# Patient Record
Sex: Female | Born: 1955 | Race: White | Hispanic: No | Smoking: Never smoker
Health system: Southern US, Community
[De-identification: ages and names within clinical notes are randomized; demographics above are authoritative.]

## PROBLEM LIST (undated history)

## (undated) DIAGNOSIS — I2699 Other pulmonary embolism without acute cor pulmonale: Secondary | ICD-10-CM

## (undated) DIAGNOSIS — K5790 Diverticulosis of intestine, part unspecified, without perforation or abscess without bleeding: Secondary | ICD-10-CM

## (undated) DIAGNOSIS — H7210 Attic perforation of tympanic membrane, unspecified ear: Secondary | ICD-10-CM

## (undated) DIAGNOSIS — M8580 Other specified disorders of bone density and structure, unspecified site: Secondary | ICD-10-CM

## (undated) DIAGNOSIS — N641 Fat necrosis of breast: Secondary | ICD-10-CM

## (undated) DIAGNOSIS — R569 Unspecified convulsions: Secondary | ICD-10-CM

## (undated) DIAGNOSIS — F419 Anxiety disorder, unspecified: Secondary | ICD-10-CM

## (undated) DIAGNOSIS — E7800 Pure hypercholesterolemia, unspecified: Secondary | ICD-10-CM

## (undated) DIAGNOSIS — Z8489 Family history of other specified conditions: Secondary | ICD-10-CM

## (undated) DIAGNOSIS — M1990 Unspecified osteoarthritis, unspecified site: Secondary | ICD-10-CM

## (undated) DIAGNOSIS — E611 Iron deficiency: Secondary | ICD-10-CM

## (undated) DIAGNOSIS — F32A Depression, unspecified: Secondary | ICD-10-CM

## (undated) DIAGNOSIS — E785 Hyperlipidemia, unspecified: Secondary | ICD-10-CM

## (undated) DIAGNOSIS — K219 Gastro-esophageal reflux disease without esophagitis: Secondary | ICD-10-CM

## (undated) DIAGNOSIS — Z8619 Personal history of other infectious and parasitic diseases: Secondary | ICD-10-CM

## (undated) DIAGNOSIS — D0510 Intraductal carcinoma in situ of unspecified breast: Principal | ICD-10-CM

## (undated) DIAGNOSIS — M778 Other enthesopathies, not elsewhere classified: Secondary | ICD-10-CM

## (undated) DIAGNOSIS — D509 Iron deficiency anemia, unspecified: Secondary | ICD-10-CM

## (undated) DIAGNOSIS — T148XXA Other injury of unspecified body region, initial encounter: Secondary | ICD-10-CM

## (undated) HISTORY — DX: Intraductal carcinoma in situ of unspecified breast: D05.10

## (undated) HISTORY — DX: Attic perforation of tympanic membrane, unspecified ear: H72.10

## (undated) HISTORY — PX: MASTECTOMY W/ NODES PARTIAL: SUR854

## (undated) HISTORY — DX: Gastro-esophageal reflux disease without esophagitis: K21.9

## (undated) HISTORY — DX: Iron deficiency anemia, unspecified: D50.9

## (undated) HISTORY — DX: Fat necrosis of breast: N64.1

## (undated) HISTORY — DX: Other specified disorders of bone density and structure, unspecified site: M85.80

## (undated) HISTORY — DX: Hyperlipidemia, unspecified: E78.5

## (undated) HISTORY — PX: EYE SURGERY: SHX253

## (undated) HISTORY — DX: Personal history of other infectious and parasitic diseases: Z86.19

## (undated) HISTORY — DX: Iron deficiency: E61.1

## (undated) HISTORY — DX: Other pulmonary embolism without acute cor pulmonale: I26.99

## (undated) HISTORY — DX: Other injury of unspecified body region, initial encounter: T14.8XXA

## (undated) HISTORY — PX: MASTECTOMY: SHX3

## (undated) HISTORY — DX: Diverticulosis of intestine, part unspecified, without perforation or abscess without bleeding: K57.90

## (undated) HISTORY — PX: TRAM: SHX5363

## (undated) HISTORY — PX: THROAT SURGERY: SHX803

## (undated) HISTORY — DX: Other enthesopathies, not elsewhere classified: M77.8

## (undated) HISTORY — PX: OTHER SURGICAL HISTORY: SHX169

---

## 1998-01-29 DIAGNOSIS — C50919 Malignant neoplasm of unspecified site of unspecified female breast: Secondary | ICD-10-CM

## 1998-01-29 DIAGNOSIS — D0510 Intraductal carcinoma in situ of unspecified breast: Secondary | ICD-10-CM

## 1998-01-29 HISTORY — DX: Malignant neoplasm of unspecified site of unspecified female breast: C50.919

## 1998-01-29 HISTORY — DX: Intraductal carcinoma in situ of unspecified breast: D05.10

## 1998-07-19 DIAGNOSIS — I2699 Other pulmonary embolism without acute cor pulmonale: Secondary | ICD-10-CM

## 1998-07-19 HISTORY — DX: Other pulmonary embolism without acute cor pulmonale: I26.99

## 1998-09-09 ENCOUNTER — Ambulatory Visit (HOSPITAL_BASED_OUTPATIENT_CLINIC_OR_DEPARTMENT_OTHER): Admission: RE | Admit: 1998-09-09 | Discharge: 1998-09-09 | Payer: Self-pay | Admitting: General Surgery

## 1998-09-09 ENCOUNTER — Encounter: Payer: Self-pay | Admitting: General Surgery

## 1998-09-26 ENCOUNTER — Encounter: Payer: Self-pay | Admitting: General Surgery

## 1998-09-28 ENCOUNTER — Ambulatory Visit (HOSPITAL_COMMUNITY): Admission: RE | Admit: 1998-09-28 | Discharge: 1998-09-28 | Payer: Self-pay | Admitting: General Surgery

## 1998-10-26 ENCOUNTER — Inpatient Hospital Stay (HOSPITAL_COMMUNITY): Admission: RE | Admit: 1998-10-26 | Discharge: 1998-10-29 | Payer: Self-pay | Admitting: General Surgery

## 1999-08-03 ENCOUNTER — Ambulatory Visit (HOSPITAL_BASED_OUTPATIENT_CLINIC_OR_DEPARTMENT_OTHER): Admission: RE | Admit: 1999-08-03 | Discharge: 1999-08-03 | Payer: Self-pay | Admitting: *Deleted

## 1999-08-03 ENCOUNTER — Encounter (INDEPENDENT_AMBULATORY_CARE_PROVIDER_SITE_OTHER): Payer: Self-pay | Admitting: *Deleted

## 2000-07-12 ENCOUNTER — Encounter: Admission: RE | Admit: 2000-07-12 | Discharge: 2000-07-12 | Payer: Self-pay | Admitting: Oncology

## 2000-07-12 ENCOUNTER — Encounter (HOSPITAL_COMMUNITY): Admission: RE | Admit: 2000-07-12 | Discharge: 2000-08-11 | Payer: Self-pay | Admitting: Oncology

## 2000-12-13 ENCOUNTER — Other Ambulatory Visit: Admission: RE | Admit: 2000-12-13 | Discharge: 2000-12-13 | Payer: Self-pay | Admitting: Gynecology

## 2000-12-13 ENCOUNTER — Encounter: Admission: RE | Admit: 2000-12-13 | Discharge: 2000-12-13 | Payer: Self-pay | Admitting: General Surgery

## 2000-12-13 ENCOUNTER — Encounter: Payer: Self-pay | Admitting: General Surgery

## 2001-02-25 ENCOUNTER — Encounter: Payer: Self-pay | Admitting: General Surgery

## 2001-02-25 ENCOUNTER — Encounter: Admission: RE | Admit: 2001-02-25 | Discharge: 2001-02-25 | Payer: Self-pay | Admitting: General Surgery

## 2001-04-23 ENCOUNTER — Encounter: Admission: RE | Admit: 2001-04-23 | Discharge: 2001-04-23 | Payer: Self-pay | Admitting: General Surgery

## 2001-04-23 ENCOUNTER — Encounter: Payer: Self-pay | Admitting: General Surgery

## 2001-06-25 ENCOUNTER — Encounter: Admission: RE | Admit: 2001-06-25 | Discharge: 2001-06-25 | Payer: Self-pay | Admitting: General Surgery

## 2001-06-25 ENCOUNTER — Encounter: Payer: Self-pay | Admitting: General Surgery

## 2001-07-11 ENCOUNTER — Encounter: Admission: RE | Admit: 2001-07-11 | Discharge: 2001-07-11 | Payer: Self-pay | Admitting: Oncology

## 2001-07-11 ENCOUNTER — Encounter (HOSPITAL_COMMUNITY): Admission: RE | Admit: 2001-07-11 | Discharge: 2001-08-10 | Payer: Self-pay | Admitting: Oncology

## 2001-09-25 ENCOUNTER — Encounter: Admission: RE | Admit: 2001-09-25 | Discharge: 2001-09-25 | Payer: Self-pay | Admitting: General Surgery

## 2001-09-25 ENCOUNTER — Encounter: Payer: Self-pay | Admitting: General Surgery

## 2001-11-06 ENCOUNTER — Encounter (HOSPITAL_COMMUNITY): Admission: RE | Admit: 2001-11-06 | Discharge: 2001-12-06 | Payer: Self-pay | Admitting: Oncology

## 2001-11-06 ENCOUNTER — Encounter: Admission: RE | Admit: 2001-11-06 | Discharge: 2001-11-06 | Payer: Self-pay | Admitting: Oncology

## 2001-12-15 ENCOUNTER — Other Ambulatory Visit: Admission: RE | Admit: 2001-12-15 | Discharge: 2001-12-15 | Payer: Self-pay | Admitting: Gynecology

## 2001-12-16 ENCOUNTER — Encounter: Admission: RE | Admit: 2001-12-16 | Discharge: 2001-12-16 | Payer: Self-pay | Admitting: General Surgery

## 2001-12-16 ENCOUNTER — Encounter: Payer: Self-pay | Admitting: General Surgery

## 2001-12-25 ENCOUNTER — Emergency Department (HOSPITAL_COMMUNITY): Admission: EM | Admit: 2001-12-25 | Discharge: 2001-12-25 | Payer: Self-pay | Admitting: Emergency Medicine

## 2002-05-06 ENCOUNTER — Encounter (HOSPITAL_COMMUNITY): Admission: RE | Admit: 2002-05-06 | Discharge: 2002-06-05 | Payer: Self-pay | Admitting: Oncology

## 2002-05-06 ENCOUNTER — Encounter: Admission: RE | Admit: 2002-05-06 | Discharge: 2002-05-06 | Payer: Self-pay | Admitting: Oncology

## 2002-07-10 ENCOUNTER — Encounter: Admission: RE | Admit: 2002-07-10 | Discharge: 2002-07-10 | Payer: Self-pay | Admitting: Oncology

## 2002-11-05 ENCOUNTER — Encounter: Admission: RE | Admit: 2002-11-05 | Discharge: 2002-11-05 | Payer: Self-pay | Admitting: Oncology

## 2002-11-05 ENCOUNTER — Encounter (HOSPITAL_COMMUNITY): Admission: RE | Admit: 2002-11-05 | Discharge: 2002-12-05 | Payer: Self-pay | Admitting: Oncology

## 2003-01-08 ENCOUNTER — Encounter: Admission: RE | Admit: 2003-01-08 | Discharge: 2003-01-08 | Payer: Self-pay | Admitting: General Surgery

## 2003-01-14 ENCOUNTER — Other Ambulatory Visit: Admission: RE | Admit: 2003-01-14 | Discharge: 2003-01-14 | Payer: Self-pay | Admitting: Gynecology

## 2003-07-21 ENCOUNTER — Encounter: Admission: RE | Admit: 2003-07-21 | Discharge: 2003-07-21 | Payer: Self-pay | Admitting: Oncology

## 2003-07-21 ENCOUNTER — Encounter (HOSPITAL_COMMUNITY): Admission: RE | Admit: 2003-07-21 | Discharge: 2003-08-20 | Payer: Self-pay | Admitting: Oncology

## 2003-11-16 ENCOUNTER — Emergency Department (HOSPITAL_COMMUNITY): Admission: EM | Admit: 2003-11-16 | Discharge: 2003-11-16 | Payer: Self-pay

## 2004-01-04 ENCOUNTER — Encounter (HOSPITAL_COMMUNITY): Admission: RE | Admit: 2004-01-04 | Discharge: 2004-01-28 | Payer: Self-pay | Admitting: Oncology

## 2004-01-04 ENCOUNTER — Ambulatory Visit (HOSPITAL_COMMUNITY): Payer: Self-pay | Admitting: Oncology

## 2004-01-04 ENCOUNTER — Encounter: Admission: RE | Admit: 2004-01-04 | Discharge: 2004-01-28 | Payer: Self-pay | Admitting: Oncology

## 2004-01-13 ENCOUNTER — Encounter: Admission: RE | Admit: 2004-01-13 | Discharge: 2004-01-13 | Payer: Self-pay | Admitting: General Surgery

## 2004-03-27 ENCOUNTER — Encounter: Admission: RE | Admit: 2004-03-27 | Discharge: 2004-03-27 | Payer: Self-pay | Admitting: Otolaryngology

## 2004-05-22 ENCOUNTER — Other Ambulatory Visit: Admission: RE | Admit: 2004-05-22 | Discharge: 2004-05-22 | Payer: Self-pay | Admitting: Gynecology

## 2004-05-22 ENCOUNTER — Ambulatory Visit (HOSPITAL_COMMUNITY): Admission: RE | Admit: 2004-05-22 | Discharge: 2004-05-22 | Payer: Self-pay | Admitting: Family Medicine

## 2004-06-21 ENCOUNTER — Ambulatory Visit: Payer: Self-pay | Admitting: Internal Medicine

## 2004-07-03 ENCOUNTER — Ambulatory Visit: Payer: Self-pay | Admitting: Pulmonary Disease

## 2004-07-04 ENCOUNTER — Ambulatory Visit (HOSPITAL_COMMUNITY): Admission: RE | Admit: 2004-07-04 | Discharge: 2004-07-04 | Payer: Self-pay | Admitting: Internal Medicine

## 2004-07-04 ENCOUNTER — Ambulatory Visit: Payer: Self-pay | Admitting: Internal Medicine

## 2004-08-14 ENCOUNTER — Ambulatory Visit (HOSPITAL_COMMUNITY): Payer: Self-pay | Admitting: Oncology

## 2004-08-14 ENCOUNTER — Encounter: Admission: RE | Admit: 2004-08-14 | Discharge: 2004-08-14 | Payer: Self-pay | Admitting: Oncology

## 2004-08-14 ENCOUNTER — Encounter (HOSPITAL_COMMUNITY): Admission: RE | Admit: 2004-08-14 | Discharge: 2004-09-13 | Payer: Self-pay | Admitting: Oncology

## 2004-09-18 ENCOUNTER — Ambulatory Visit (HOSPITAL_COMMUNITY): Admission: RE | Admit: 2004-09-18 | Discharge: 2004-09-18 | Payer: Self-pay | Admitting: Family Medicine

## 2005-01-15 ENCOUNTER — Encounter: Admission: RE | Admit: 2005-01-15 | Discharge: 2005-01-15 | Payer: Self-pay | Admitting: General Surgery

## 2005-02-14 ENCOUNTER — Ambulatory Visit (HOSPITAL_COMMUNITY): Payer: Self-pay | Admitting: Oncology

## 2005-02-14 ENCOUNTER — Encounter (HOSPITAL_COMMUNITY): Admission: RE | Admit: 2005-02-14 | Discharge: 2005-03-16 | Payer: Self-pay | Admitting: Oncology

## 2005-02-14 ENCOUNTER — Encounter: Admission: RE | Admit: 2005-02-14 | Discharge: 2005-02-14 | Payer: Self-pay | Admitting: Oncology

## 2005-05-22 ENCOUNTER — Ambulatory Visit (HOSPITAL_COMMUNITY): Admission: RE | Admit: 2005-05-22 | Discharge: 2005-05-22 | Payer: Self-pay | Admitting: Family Medicine

## 2005-05-23 ENCOUNTER — Other Ambulatory Visit: Admission: RE | Admit: 2005-05-23 | Discharge: 2005-05-23 | Payer: Self-pay | Admitting: Gynecology

## 2005-07-17 ENCOUNTER — Ambulatory Visit (HOSPITAL_COMMUNITY): Admission: RE | Admit: 2005-07-17 | Discharge: 2005-07-17 | Payer: Self-pay | Admitting: Gynecology

## 2005-08-06 ENCOUNTER — Ambulatory Visit (HOSPITAL_COMMUNITY): Admission: RE | Admit: 2005-08-06 | Discharge: 2005-08-06 | Payer: Self-pay | Admitting: Family Medicine

## 2005-08-14 ENCOUNTER — Encounter: Admission: RE | Admit: 2005-08-14 | Discharge: 2005-08-14 | Payer: Self-pay | Admitting: Oncology

## 2005-08-14 ENCOUNTER — Encounter (HOSPITAL_COMMUNITY): Admission: RE | Admit: 2005-08-14 | Discharge: 2005-09-13 | Payer: Self-pay | Admitting: Oncology

## 2005-08-14 ENCOUNTER — Ambulatory Visit (HOSPITAL_COMMUNITY): Payer: Self-pay | Admitting: Oncology

## 2005-08-23 ENCOUNTER — Ambulatory Visit (HOSPITAL_COMMUNITY): Admission: RE | Admit: 2005-08-23 | Discharge: 2005-08-23 | Payer: Self-pay | Admitting: Family Medicine

## 2005-09-06 ENCOUNTER — Encounter: Admission: RE | Admit: 2005-09-06 | Discharge: 2005-09-06 | Payer: Self-pay | Admitting: Orthopedic Surgery

## 2005-09-10 ENCOUNTER — Encounter: Admission: RE | Admit: 2005-09-10 | Discharge: 2005-09-10 | Payer: Self-pay | Admitting: Orthopedic Surgery

## 2006-01-17 ENCOUNTER — Encounter: Admission: RE | Admit: 2006-01-17 | Discharge: 2006-01-17 | Payer: Self-pay | Admitting: General Surgery

## 2006-02-15 ENCOUNTER — Encounter (HOSPITAL_COMMUNITY): Admission: RE | Admit: 2006-02-15 | Discharge: 2006-03-17 | Payer: Self-pay | Admitting: Oncology

## 2006-02-15 ENCOUNTER — Ambulatory Visit (HOSPITAL_COMMUNITY): Payer: Self-pay | Admitting: Oncology

## 2006-08-08 ENCOUNTER — Encounter (HOSPITAL_COMMUNITY): Admission: RE | Admit: 2006-08-08 | Discharge: 2006-09-07 | Payer: Self-pay | Admitting: Oncology

## 2006-08-08 ENCOUNTER — Ambulatory Visit (HOSPITAL_COMMUNITY): Payer: Self-pay | Admitting: Oncology

## 2006-09-05 ENCOUNTER — Other Ambulatory Visit: Admission: RE | Admit: 2006-09-05 | Discharge: 2006-09-05 | Payer: Self-pay | Admitting: Gynecology

## 2007-01-21 ENCOUNTER — Encounter: Admission: RE | Admit: 2007-01-21 | Discharge: 2007-01-21 | Payer: Self-pay | Admitting: Family Medicine

## 2007-02-11 ENCOUNTER — Encounter (HOSPITAL_COMMUNITY): Admission: RE | Admit: 2007-02-11 | Discharge: 2007-03-13 | Payer: Self-pay | Admitting: Oncology

## 2007-02-11 ENCOUNTER — Ambulatory Visit (HOSPITAL_COMMUNITY): Payer: Self-pay | Admitting: Oncology

## 2007-03-14 ENCOUNTER — Ambulatory Visit (HOSPITAL_COMMUNITY): Admission: RE | Admit: 2007-03-14 | Discharge: 2007-03-15 | Payer: Self-pay | Admitting: Family Medicine

## 2007-07-24 ENCOUNTER — Encounter (HOSPITAL_COMMUNITY): Admission: RE | Admit: 2007-07-24 | Discharge: 2007-08-23 | Payer: Self-pay | Admitting: Oncology

## 2007-07-24 ENCOUNTER — Ambulatory Visit (HOSPITAL_COMMUNITY): Payer: Self-pay | Admitting: Oncology

## 2008-01-19 ENCOUNTER — Ambulatory Visit (HOSPITAL_COMMUNITY): Payer: Self-pay | Admitting: Oncology

## 2008-01-19 ENCOUNTER — Encounter (HOSPITAL_COMMUNITY): Admission: RE | Admit: 2008-01-19 | Discharge: 2008-01-27 | Payer: Self-pay | Admitting: Oncology

## 2008-01-28 ENCOUNTER — Encounter: Admission: RE | Admit: 2008-01-28 | Discharge: 2008-01-28 | Payer: Self-pay | Admitting: Surgery

## 2008-05-23 ENCOUNTER — Encounter: Admission: RE | Admit: 2008-05-23 | Discharge: 2008-05-23 | Payer: Self-pay | Admitting: Orthopedic Surgery

## 2008-09-03 ENCOUNTER — Ambulatory Visit (HOSPITAL_COMMUNITY): Payer: Self-pay | Admitting: Oncology

## 2008-09-03 ENCOUNTER — Encounter (HOSPITAL_COMMUNITY): Admission: RE | Admit: 2008-09-03 | Discharge: 2008-10-03 | Payer: Self-pay | Admitting: Oncology

## 2008-12-09 ENCOUNTER — Ambulatory Visit (HOSPITAL_COMMUNITY): Admission: RE | Admit: 2008-12-09 | Discharge: 2008-12-09 | Payer: Self-pay | Admitting: Oncology

## 2008-12-09 ENCOUNTER — Ambulatory Visit (HOSPITAL_COMMUNITY): Payer: Self-pay | Admitting: Oncology

## 2009-01-16 IMAGING — MG MM SCREEN MAMMOGRAM BILATERAL
3 series · 3 of 3 positions shown · non-contrast
Comparison: none

DG SCREEN MAMMOGRAM BILATERAL
Bilateral CC and MLO view(s) were taken.

DIGITAL SCREENING MAMMOGRAM WITH CAD:
There are scattered fibroglandular densities.  Normal right TRAM noted.  No masses or malignant 
type calcifications are identified.  Compared with prior studies.

[L CC]
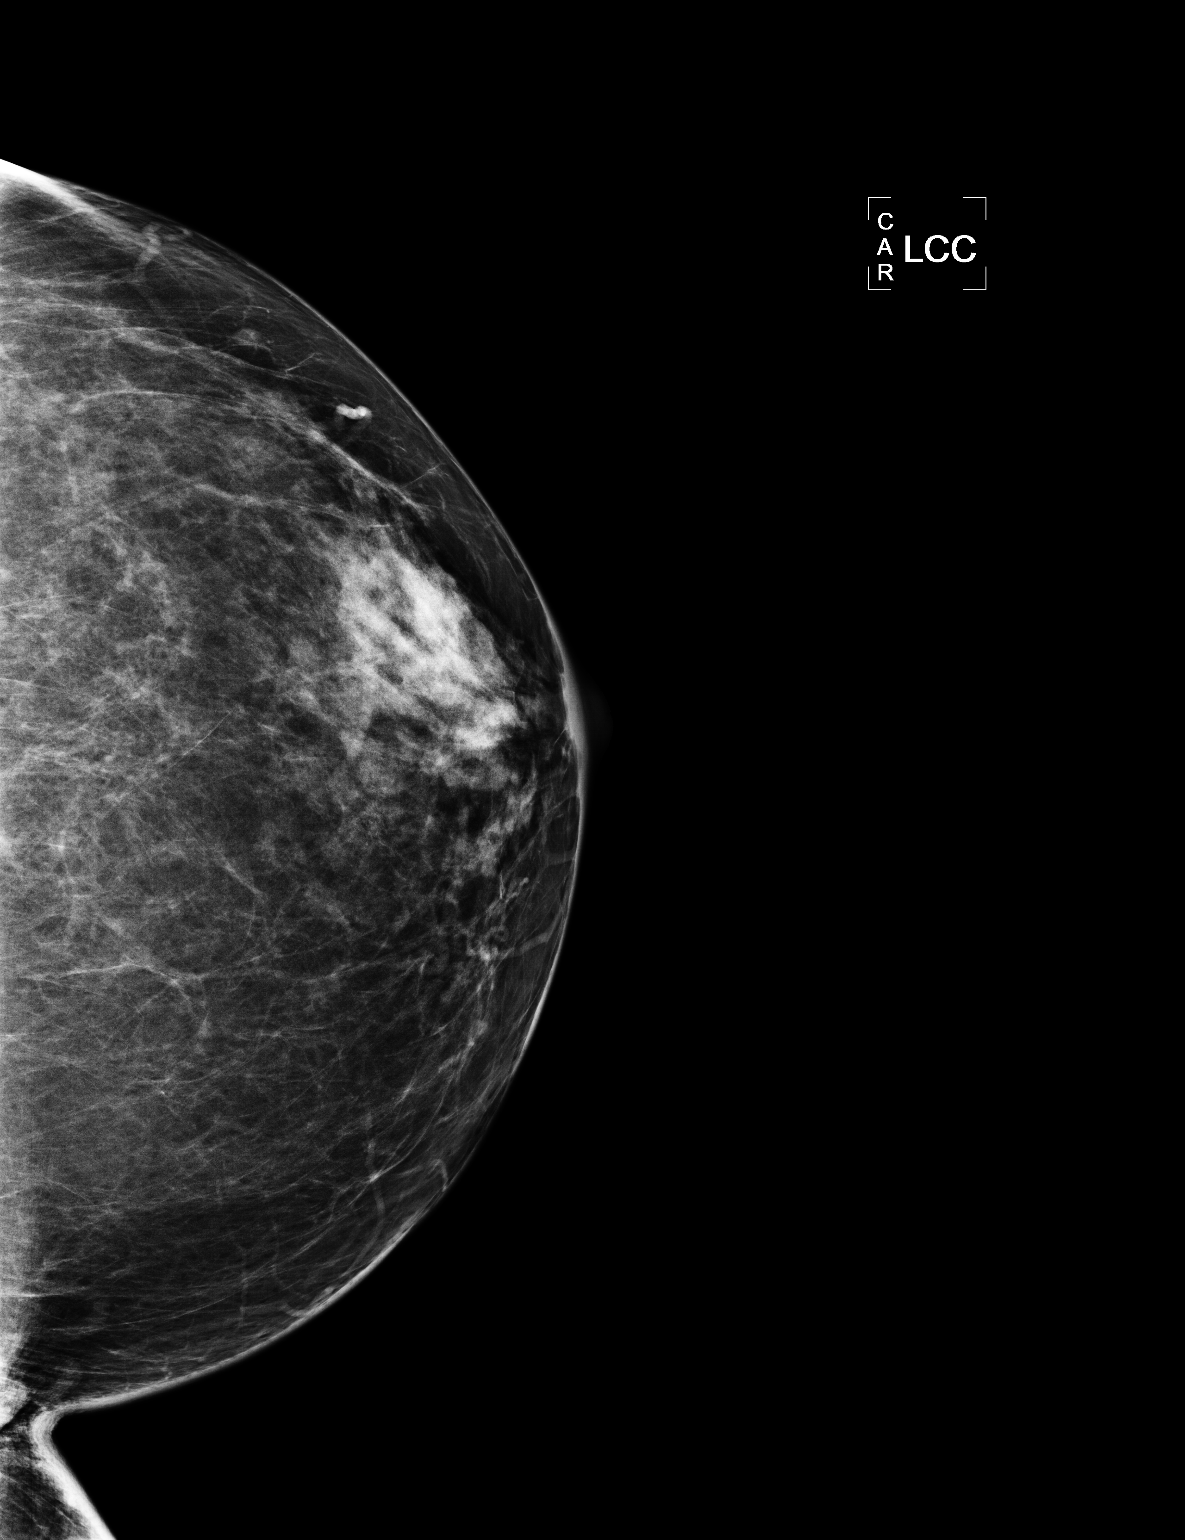

[L MLO]
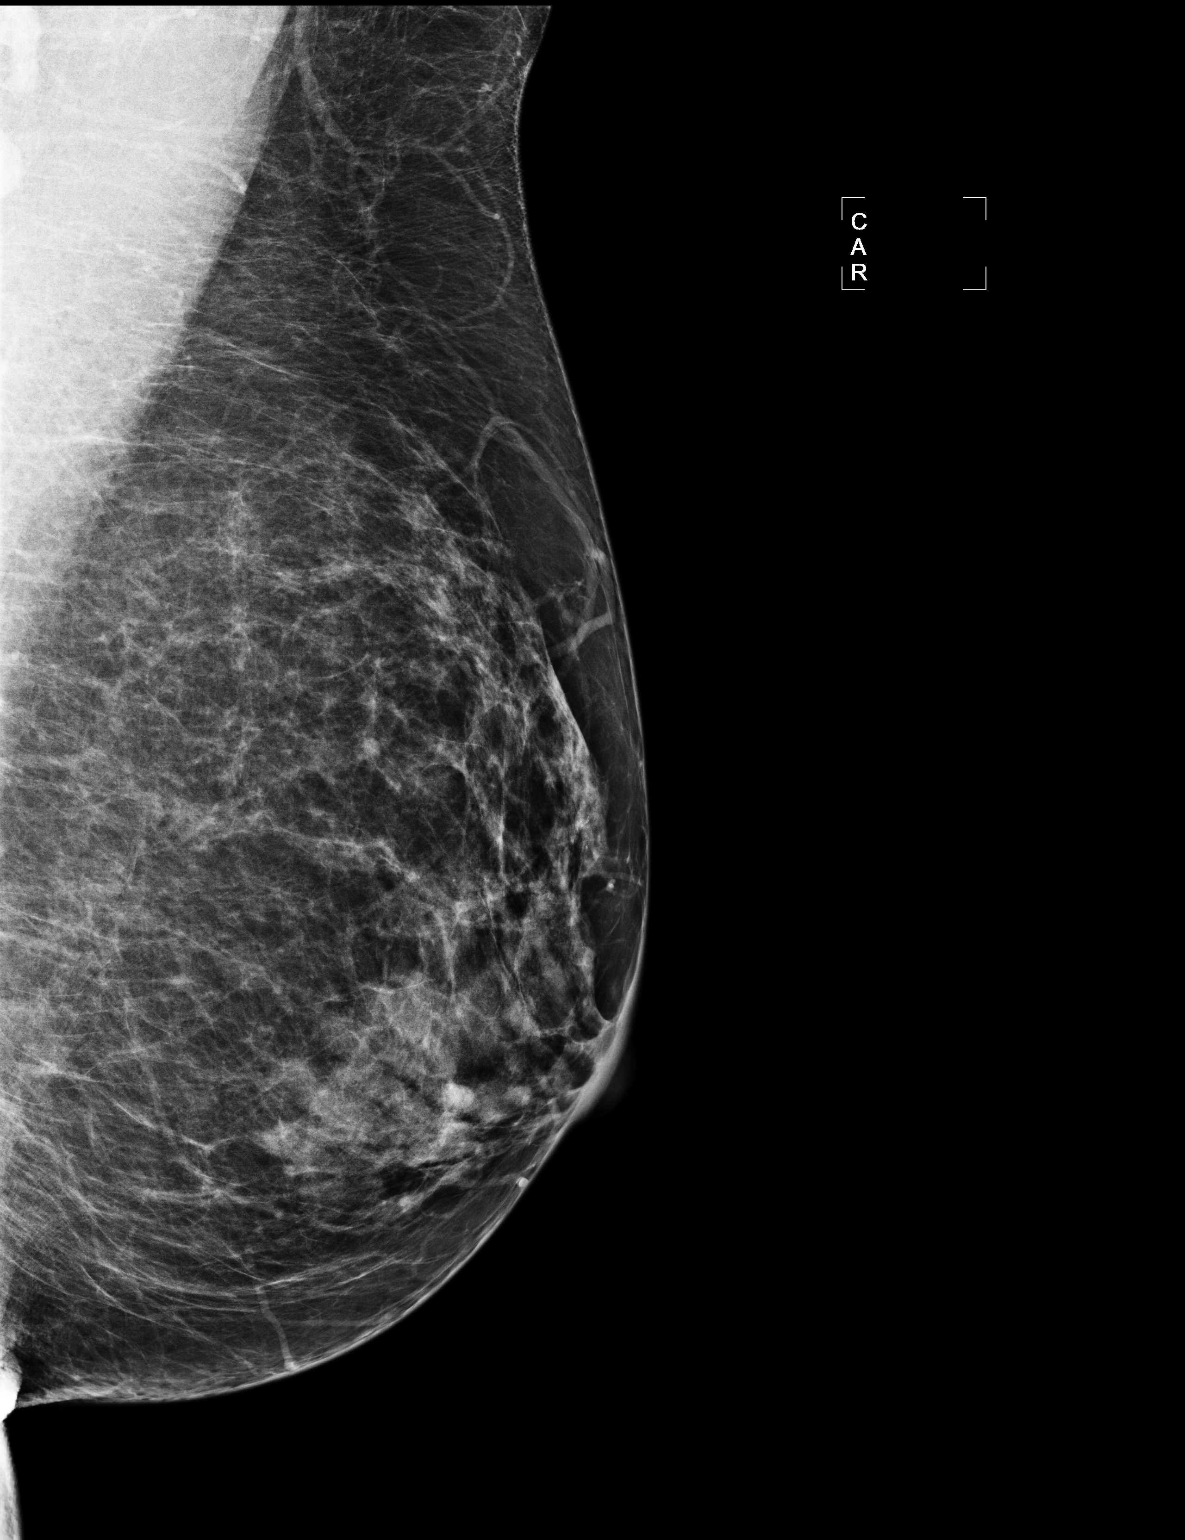

[R MLO]
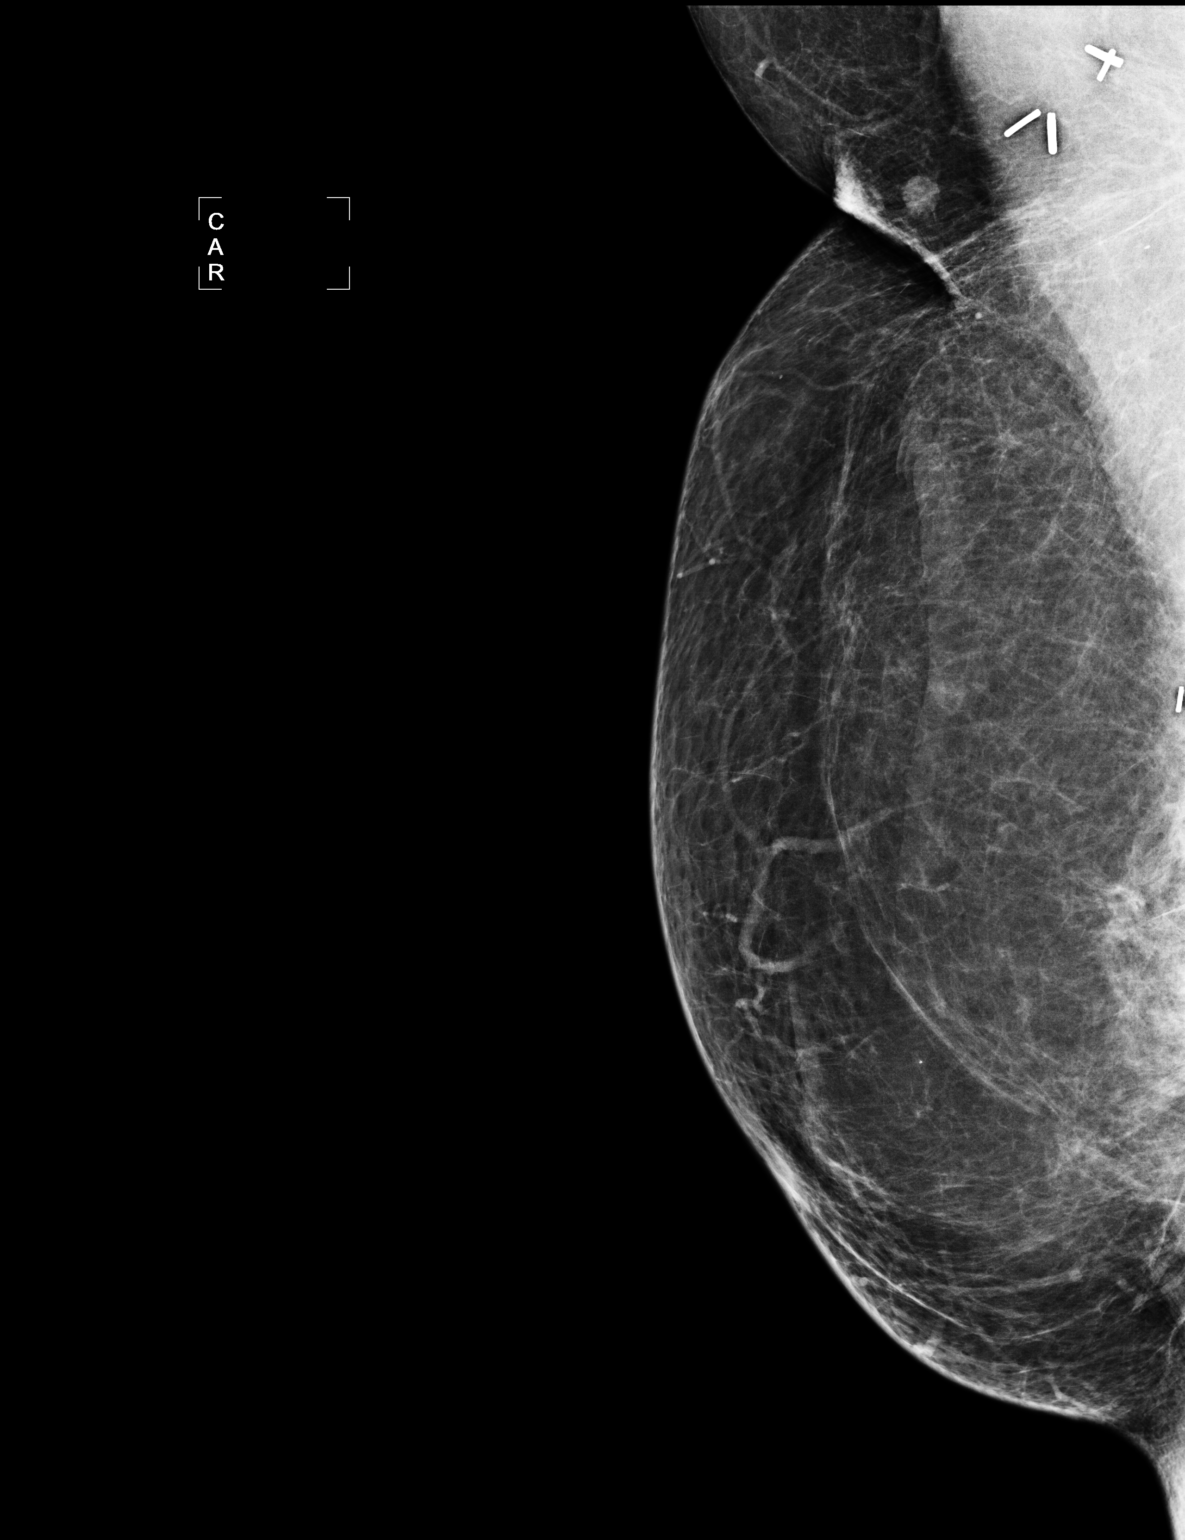

[3 of 3 positions shown; findings below may reference images not displayed]

IMPRESSION: No specific mammographic evidence of malignancy.  Next screening mammogram is recommended in one 
year.

ASSESSMENT: Negative - BI-RADS 1

Screening mammogram in 1 year.
ANALYZED BY COMPUTER AIDED DETECTION. , THIS PROCEDURE WAS A DIGITAL MAMMOGRAM.

## 2009-02-01 ENCOUNTER — Encounter: Admission: RE | Admit: 2009-02-01 | Discharge: 2009-02-01 | Payer: Self-pay | Admitting: Oncology

## 2009-02-03 ENCOUNTER — Encounter (HOSPITAL_COMMUNITY): Admission: RE | Admit: 2009-02-03 | Discharge: 2009-03-05 | Payer: Self-pay | Admitting: Oncology

## 2009-02-03 ENCOUNTER — Ambulatory Visit (HOSPITAL_COMMUNITY): Payer: Self-pay | Admitting: Oncology

## 2009-02-17 ENCOUNTER — Encounter: Admission: RE | Admit: 2009-02-17 | Discharge: 2009-02-17 | Payer: Self-pay | Admitting: Oncology

## 2009-03-31 ENCOUNTER — Ambulatory Visit (HOSPITAL_COMMUNITY): Payer: Self-pay | Admitting: Oncology

## 2009-03-31 ENCOUNTER — Encounter (HOSPITAL_COMMUNITY): Admission: RE | Admit: 2009-03-31 | Discharge: 2009-04-30 | Payer: Self-pay | Admitting: Oncology

## 2009-06-09 ENCOUNTER — Ambulatory Visit (HOSPITAL_COMMUNITY): Payer: Self-pay | Admitting: Oncology

## 2009-07-12 ENCOUNTER — Encounter (HOSPITAL_COMMUNITY): Admission: RE | Admit: 2009-07-12 | Discharge: 2009-08-11 | Payer: Self-pay | Admitting: Oncology

## 2009-07-22 ENCOUNTER — Encounter (INDEPENDENT_AMBULATORY_CARE_PROVIDER_SITE_OTHER): Payer: Self-pay

## 2009-09-06 ENCOUNTER — Encounter (HOSPITAL_COMMUNITY): Admission: RE | Admit: 2009-09-06 | Discharge: 2009-10-06 | Payer: Self-pay | Admitting: Oncology

## 2009-09-06 ENCOUNTER — Ambulatory Visit (HOSPITAL_COMMUNITY): Payer: Self-pay | Admitting: Oncology

## 2009-11-29 ENCOUNTER — Encounter (HOSPITAL_COMMUNITY)
Admission: RE | Admit: 2009-11-29 | Discharge: 2009-12-29 | Payer: Self-pay | Source: Home / Self Care | Admitting: Oncology

## 2009-11-29 ENCOUNTER — Ambulatory Visit (HOSPITAL_COMMUNITY): Payer: Self-pay | Admitting: Oncology

## 2010-02-02 ENCOUNTER — Encounter
Admission: RE | Admit: 2010-02-02 | Discharge: 2010-02-02 | Payer: Self-pay | Source: Home / Self Care | Attending: Oncology | Admitting: Oncology

## 2010-02-09 ENCOUNTER — Ambulatory Visit (HOSPITAL_COMMUNITY)
Admission: RE | Admit: 2010-02-09 | Discharge: 2010-02-28 | Payer: Self-pay | Source: Home / Self Care | Attending: Oncology | Admitting: Oncology

## 2010-02-09 ENCOUNTER — Encounter (HOSPITAL_COMMUNITY)
Admission: RE | Admit: 2010-02-09 | Discharge: 2010-02-28 | Payer: Self-pay | Source: Home / Self Care | Attending: Oncology | Admitting: Oncology

## 2010-02-13 LAB — COMPREHENSIVE METABOLIC PANEL
ALT: 19 U/L (ref 0–35)
AST: 19 U/L (ref 0–37)
Albumin: 4.4 g/dL (ref 3.5–5.2)
Alkaline Phosphatase: 53 U/L (ref 39–117)
BUN: 20 mg/dL (ref 6–23)
CO2: 28 mEq/L (ref 19–32)
Calcium: 9.4 mg/dL (ref 8.4–10.5)
Chloride: 104 mEq/L (ref 96–112)
Creatinine, Ser: 0.81 mg/dL (ref 0.4–1.2)
GFR calc Af Amer: >60 mL/min (ref >60)
GFR calc non Af Amer: >60 mL/min (ref >60)
Glucose, Bld: 88 mg/dL (ref 70–99)
Potassium: 4.2 mEq/L (ref 3.5–5.1)
Sodium: 140 mEq/L (ref 135–145)
Total Bilirubin: 0.9 mg/dL (ref 0.3–1.2)
Total Protein: 6.8 g/dL (ref 6.0–8.3)

## 2010-02-13 LAB — LIPID PANEL
Cholesterol: 183 mg/dL (ref 0–200)
HDL: 69 mg/dL (ref >39)
LDL Cholesterol: 106 mg/dL — ABNORMAL HIGH (ref 0–99)
Total CHOL/HDL Ratio: 2.7 RATIO
Triglycerides: 41 mg/dL (ref <150)
VLDL: 8 mg/dL (ref 0–40)

## 2010-02-13 LAB — DIFFERENTIAL
Basophils Absolute: 0 10*3/uL (ref 0.0–0.1)
Basophils Relative: 1 % (ref 0–1)
Eosinophils Absolute: 0.2 10*3/uL (ref 0.0–0.7)
Eosinophils Relative: 4 % (ref 0–5)
Lymphocytes Relative: 25 % (ref 12–46)
Lymphs Abs: 1.1 10*3/uL (ref 0.7–4.0)
Monocytes Absolute: 0.4 10*3/uL (ref 0.1–1.0)
Monocytes Relative: 9 % (ref 3–12)
Neutro Abs: 2.8 10*3/uL (ref 1.7–7.7)
Neutrophils Relative %: 62 % (ref 43–77)

## 2010-02-13 LAB — CBC
HCT: 39.7 % (ref 36.0–46.0)
Hemoglobin: 13.6 g/dL (ref 12.0–15.0)
MCH: 29.4 pg (ref 26.0–34.0)
MCHC: 34.3 g/dL (ref 30.0–36.0)
MCV: 85.9 fL (ref 78.0–100.0)
Platelets: 181 10*3/uL (ref 150–400)
RBC: 4.62 MIL/uL (ref 3.87–5.11)
RDW: 13 % (ref 11.5–15.5)
WBC: 4.6 10*3/uL (ref 4.0–10.5)

## 2010-02-13 LAB — FERRITIN: Ferritin: 32 ng/mL (ref 10–291)

## 2010-02-27 ENCOUNTER — Ambulatory Visit
Admission: RE | Admit: 2010-02-27 | Discharge: 2010-02-27 | Payer: Self-pay | Source: Home / Self Care | Attending: Orthopedic Surgery | Admitting: Orthopedic Surgery

## 2010-02-27 ENCOUNTER — Encounter: Payer: Self-pay | Admitting: Orthopedic Surgery

## 2010-02-27 DIAGNOSIS — M23329 Other meniscus derangements, posterior horn of medial meniscus, unspecified knee: Secondary | ICD-10-CM | POA: Insufficient documentation

## 2010-02-27 DIAGNOSIS — M2240 Chondromalacia patellae, unspecified knee: Secondary | ICD-10-CM | POA: Insufficient documentation

## 2010-02-27 DIAGNOSIS — M629 Disorder of muscle, unspecified: Secondary | ICD-10-CM | POA: Insufficient documentation

## 2010-02-28 NOTE — Letter (Signed)
Summary: Recall Colonoscopy/Endoscopy, Change to Office Visit  [facility]  [address]   [place], [place] [address]   Phone: [phone]  Fax: [phone]      July 22, 2009   Kaitlyn Morrison [address] [place], [place]  [address] [birth year]   Dear [name],   According to our records, it is time for you to schedule a Colonoscopy/Endoscopy. However, after reviewing your medical record, we recommend an office visit in order to determine your need for a repeat procedure.  Please call [phone] at your convenience to schedule an office visit. If you have any questions or concerns, please feel free to contact our office.   Sincerely,   Cloria Spring LPN  [facility] Ph: [phone]   Fax: [phone]

## 2010-03-06 ENCOUNTER — Other Ambulatory Visit: Payer: Self-pay | Admitting: Orthopedic Surgery

## 2010-03-06 ENCOUNTER — Encounter (INDEPENDENT_AMBULATORY_CARE_PROVIDER_SITE_OTHER): Payer: Self-pay | Admitting: *Deleted

## 2010-03-06 DIAGNOSIS — S83249A Other tear of medial meniscus, current injury, unspecified knee, initial encounter: Secondary | ICD-10-CM

## 2010-03-08 NOTE — Assessment & Plan Note (Signed)
Summary: left knee pain/swelling needs xr/OK per Dr Natasha Mead appt/...   Vital Signs:  Patient profile:   55 year old female Height:      64 inches Weight:      127 pounds BMI:     21.88 Temp:     98 degrees F Resp:     20 per minute  Visit Type:  new patient  CC:  left knee pain.  History of Present Illness: I saw Kaitlyn Morrison in the office today for an initial visit.  She is a 55 years old woman with the complaint of:  left knee pain.  Xrays today.  Medications: Lipitor, Singulair, Alendronate Sodium, Lorazempam, Amitriptyline, Ranitidine, Pantoprazole.  The patient has been experiencing posterior medial knee pain for the last 2 weeks. Although she reports no injury. She reports increased pain and swelling unrelieved by ibuprofen. She is having pain when she is sitting for long periods of time and also pain when she is walking. The pain is described as 7/10, burning, intermittent and worse with walking. She is experienced several catching episodes. It seems to be better when she is not walking. It seems to come on when she gets up and down or climbs.      Allergies (verified): 1)  ! * Ivp Dye  Past History:  Past Medical History: Breast cancer Reflux low iron  Past Surgical History: Mastectomy right Reconstruction 2000 Throat 2007 Eye 1998  Family History: FH of Cancer:  Family History of Diabetes Family History Coronary Heart Disease female < 16  Social History: Patient is married.  Manager office  alcohol no / smoking no   Review of Systems Constitutional:  Denies weight loss, weight gain, fever, chills, and fatigue. Cardiovascular:  Denies chest pain, palpitations, fainting, and murmurs. Respiratory:  Denies short of breath, wheezing, couch, tightness, pain on inspiration, and snoring . Gastrointestinal:  Denies heartburn, nausea, vomiting, diarrhea, constipation, and blood in your stools. Genitourinary:  Denies frequency, urgency, difficulty  urinating, painful urination, flank pain, and bleeding in urine. Neurologic:  Complains of dizziness; denies numbness, tingling, unsteady gait, tremors, and seizure. Musculoskeletal:  Complains of swelling; denies joint pain, instability, stiffness, redness, heat, and muscle pain. Endocrine:  Denies excessive thirst, exessive urination, and heat or cold intolerance. Psychiatric:  Denies nervousness, depression, anxiety, and hallucinations. Skin:  Denies changes in the skin, poor healing, rash, itching, and redness. HEENT:  Complains of blurred or double vision; denies eye pain, redness, and watering. Immunology:  Complains of seasonal allergies; denies sinus problems and allergic to bee stings. Hemoatologic:  Denies easy bleeding and brusing.  Physical Exam  Additional Exam:  GEN: well developed, well nourished, normal grooming and hygiene, no deformity and normal body habitus.   CDV: pulses are normal, no edema, no erythema. no tenderness  Lymph: normal lymph nodes   Skin: no rashes, skin lesions or open sores   NEURO: normal coordination, reflexes, sensation.   Psyche: awake, alert and oriented. 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 X-rays shows that she has no signs of arthritic change in the knee.  3 views of the  LEFT knee.  Normal. Joint spaces normal alignment normal bone.  Impression normal x-ray.  Differential diagnoses patellofemoral chondromalacia, torn medial meniscus. Iliotibial band syndrome.  Recommend MRI based on a catching sensation in the positive McMurray sign and  loss of full flexion and extension  Orders: New Patient Level III  (08657) Knee x-ray,  3 views (84696)  Problem # 2:  ILIOTIBIAL BAND SYNDROME, LEFT KNEE (ICD-728.89) Assessment: New  Orders: New Patient Level III (29528) Knee x-ray,  3 views (41324)  Problem # 3:  DERANGEMENT OF POSTERIOR HORN OF MEDIAL MENISCUS (ICD-717.2) Assessment: New  Orders: New Patient Level III (40102) Knee x-ray,  3 views (72536)  Medications Added to Medication List This Visit: 1)  Alprazolam 1 Mg Tabs (Alprazolam) .Marland Kitchen.. 1 tablet by mouth 30 minutes before mri  Patient Instructions: 1)  MRI LEFT knee. 2)  Continue ice, ibuprofen and activity modification  Prescriptions: ALPRAZOLAM 1 MG TABS (ALPRAZOLAM) 1 tablet by mouth 30 minutes before MRI  #1 x 0   Entered and Authorized by:   Fuller Canada MD   Signed by:   Fuller Canada MD on 02/27/2010   Method used:   Print then Give to Patient   RxID:   [id number]    Orders Added: 1)  New Patient Level III [56433] 2)  Knee x-ray,  3 views [73562]

## 2010-03-09 ENCOUNTER — Ambulatory Visit (HOSPITAL_COMMUNITY)
Admission: RE | Admit: 2010-03-09 | Discharge: 2010-03-09 | Disposition: A | Discharge status: Home / Self Care | Payer: BC Managed Care – PPO | Source: Ambulatory Visit | Attending: Orthopedic Surgery | Admitting: Orthopedic Surgery

## 2010-03-09 DIAGNOSIS — M2240 Chondromalacia patellae, unspecified knee: Secondary | ICD-10-CM | POA: Insufficient documentation

## 2010-03-09 DIAGNOSIS — M25569 Pain in unspecified knee: Secondary | ICD-10-CM | POA: Insufficient documentation

## 2010-03-09 DIAGNOSIS — M7120 Synovial cyst of popliteal space [Baker], unspecified knee: Secondary | ICD-10-CM | POA: Insufficient documentation

## 2010-03-09 DIAGNOSIS — Z853 Personal history of malignant neoplasm of breast: Secondary | ICD-10-CM | POA: Insufficient documentation

## 2010-03-09 DIAGNOSIS — S83249A Other tear of medial meniscus, current injury, unspecified knee, initial encounter: Secondary | ICD-10-CM

## 2010-03-10 ENCOUNTER — Ambulatory Visit (HOSPITAL_COMMUNITY): Payer: BC Managed Care – PPO

## 2010-03-10 DIAGNOSIS — D0590 Unspecified type of carcinoma in situ of unspecified breast: Secondary | ICD-10-CM

## 2010-03-10 DIAGNOSIS — D509 Iron deficiency anemia, unspecified: Secondary | ICD-10-CM

## 2010-03-13 ENCOUNTER — Encounter: Payer: Self-pay | Admitting: Orthopedic Surgery

## 2010-03-14 ENCOUNTER — Ambulatory Visit (INDEPENDENT_AMBULATORY_CARE_PROVIDER_SITE_OTHER): Payer: BC Managed Care – PPO | Admitting: Orthopedic Surgery

## 2010-03-14 ENCOUNTER — Encounter: Payer: Self-pay | Admitting: Orthopedic Surgery

## 2010-03-14 DIAGNOSIS — M2240 Chondromalacia patellae, unspecified knee: Secondary | ICD-10-CM

## 2010-03-16 NOTE — Miscellaneous (Signed)
Summary: mri left knee aph 2-9/12 reg 630pm  Clinical Lists Changes    Precert for [facility] [id number] expires 04/04/10, to fu for results, to bring disc, advised pt.

## 2010-03-22 NOTE — Miscellaneous (Signed)
Summary: knee mri  IMPRESSION:   1.  Normal menisci. 2.  Inflammatory changes at the origin of the medial head of the gastrocnemius, nonspecific. 3.  Chondromalacia of the posterior aspect of the lateral femoral condyle and of the lateral facet of the patella. 4.  Tiny Baker's cyst.   Original Report Authenticated By: Gwynn Burly, M.D.Clinical Lists Changes

## 2010-03-22 NOTE — Assessment & Plan Note (Signed)
Summary: mri appt aph to bring disc for appt.cbt   Visit Type:  Follow-up  CC:  left knee pain.  History of Present Illness: I saw Kaitlyn Morrison in the office today for a followup visit.  She is a 54 years old woman with the complaint of:  left knee  Medications: Lipitor, Singulair, Alendronate Sodium, Lorazempam, Amitriptyline, Ranitidine, Pantoprazole. Ibuprofen.  She says her LEFT knee is better, but her RIGHT knee is bothering her now more than the LEFT knee.  LEFT knee MRI results below.  MRI results:   IMPRESSION:   1.  Normal menisci. 2.  Inflammatory changes at the origin of the medial head of the gastrocnemius, nonspecific. 3.  Chondromalacia of the posterior aspect of the lateral femoral condyle and of the lateral facet of the patella. 4.  Tiny Baker's cyst.   Original Report Authenticated By: Gwynn Burly, M.D.   Allergies: 1)  ! * Ivp Dye  Physical Exam  Additional Exam:  evaluation of the RIGHT knee  Crepitus at the 30-0 range of motion with tender medial lateral facets, no effusion, full range of motion, ACL, PCL, stable collateral ligaments, stable.  No meniscal abnormalities.     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  Recommend exercises, activity modification, and a two-month recheck  Orders: Est. Patient Level III ([address])  Patient Instructions: 1)  Please schedule a follow-up appointment in 2 months.   Orders Added: 1)  Est. Patient Level III [60454]

## 2010-03-22 NOTE — Letter (Signed)
Summary: History form  History form   Imported By: Jacklynn Ganong 03/17/2010 09:55:30  _____________________________________________________________________  External Attachment:    Type:   Image     Comment:   External Document

## 2010-04-11 LAB — CBC
HCT: 41.7 % (ref 36.0–46.0)
Hemoglobin: 14.3 g/dL (ref 12.0–15.0)
MCH: 31.1 pg (ref 26.0–34.0)
MCHC: 34.2 g/dL (ref 30.0–36.0)
MCV: 90.9 fL (ref 78.0–100.0)
Platelets: 163 10*3/uL (ref 150–400)
RBC: 4.59 MIL/uL (ref 3.87–5.11)
RDW: 12.6 % (ref 11.5–15.5)
WBC: 5.3 10*3/uL (ref 4.0–10.5)

## 2010-04-11 LAB — FERRITIN: Ferritin: 72 ng/mL (ref 10–291)

## 2010-04-14 LAB — CBC
HCT: 42.5 % (ref 36.0–46.0)
Hemoglobin: 14.5 g/dL (ref 12.0–15.0)
MCH: 31.3 pg (ref 26.0–34.0)
MCHC: 34.2 g/dL (ref 30.0–36.0)
MCV: 91.5 fL (ref 78.0–100.0)
Platelets: 153 10*3/uL (ref 150–400)
RBC: 4.64 MIL/uL (ref 3.87–5.11)
RDW: 13.1 % (ref 11.5–15.5)
WBC: 4.7 10*3/uL (ref 4.0–10.5)

## 2010-04-14 LAB — FERRITIN: Ferritin: 146 ng/mL (ref 10–291)

## 2010-04-15 LAB — COMPREHENSIVE METABOLIC PANEL
ALT: 33 U/L (ref 0–35)
AST: 29 U/L (ref 0–37)
Albumin: 4.3 g/dL (ref 3.5–5.2)
Alkaline Phosphatase: 49 U/L (ref 39–117)
BUN: 23 mg/dL (ref 6–23)
CO2: 26 mEq/L (ref 19–32)
Calcium: 9.6 mg/dL (ref 8.4–10.5)
Chloride: 106 mEq/L (ref 96–112)
Creatinine, Ser: 0.79 mg/dL (ref 0.4–1.2)
GFR calc Af Amer: >60 mL/min (ref >60)
GFR calc non Af Amer: >60 mL/min (ref >60)
Glucose, Bld: 95 mg/dL (ref 70–99)
Potassium: 4 mEq/L (ref 3.5–5.1)
Sodium: 141 mEq/L (ref 135–145)
Total Bilirubin: 0.7 mg/dL (ref 0.3–1.2)
Total Protein: 7.1 g/dL (ref 6.0–8.3)

## 2010-04-15 LAB — LIPID PANEL
Cholesterol: 194 mg/dL (ref 0–200)
HDL: 62 mg/dL (ref >39)
LDL Cholesterol: 117 mg/dL — ABNORMAL HIGH (ref 0–99)
Total CHOL/HDL Ratio: 3.1 RATIO
Triglycerides: 75 mg/dL (ref <150)
VLDL: 15 mg/dL (ref 0–40)

## 2010-04-15 LAB — CBC
HCT: 43.7 % (ref 36.0–46.0)
Hemoglobin: 14.7 g/dL (ref 12.0–15.0)
MCHC: 33.7 g/dL (ref 30.0–36.0)
MCV: 90 fL (ref 78.0–100.0)
Platelets: 175 10*3/uL (ref 150–400)
RBC: 4.86 MIL/uL (ref 3.87–5.11)
RDW: 13.5 % (ref 11.5–15.5)
WBC: 5.5 10*3/uL (ref 4.0–10.5)

## 2010-04-15 LAB — FERRITIN: Ferritin: 41 ng/mL (ref 10–291)

## 2010-04-17 LAB — CBC
HCT: 41.2 % (ref 36.0–46.0)
Hemoglobin: 14.1 g/dL (ref 12.0–15.0)
MCHC: 34.3 g/dL (ref 30.0–36.0)
MCV: 88.9 fL (ref 78.0–100.0)
Platelets: 142 10*3/uL — ABNORMAL LOW (ref 150–400)
RBC: 4.63 MIL/uL (ref 3.87–5.11)
RDW: 15.8 % — ABNORMAL HIGH (ref 11.5–15.5)
WBC: 5.5 10*3/uL (ref 4.0–10.5)

## 2010-04-17 LAB — FERRITIN: Ferritin: 361 ng/mL — ABNORMAL HIGH (ref 10–291)

## 2010-04-21 LAB — CBC
HCT: 39 % (ref 36.0–46.0)
Hemoglobin: 13.4 g/dL (ref 12.0–15.0)
MCHC: 34.3 g/dL (ref 30.0–36.0)
MCV: 88.7 fL (ref 78.0–100.0)
Platelets: 175 10*3/uL (ref 150–400)
RBC: 4.4 MIL/uL (ref 3.87–5.11)
RDW: 12.5 % (ref 11.5–15.5)
WBC: 4.7 10*3/uL (ref 4.0–10.5)

## 2010-04-21 LAB — HEMOCCULT GUIAC POC 1CARD (OFFICE)
Fecal Occult Bld: NEGATIVE
Fecal Occult Bld: NEGATIVE
Fecal Occult Bld: NEGATIVE
Fecal Occult Bld: NEGATIVE
Fecal Occult Bld: NEGATIVE
Fecal Occult Bld: NEGATIVE

## 2010-04-21 LAB — FERRITIN: Ferritin: 6 ng/mL — ABNORMAL LOW (ref 10–291)

## 2010-05-03 LAB — CBC
HCT: 40.2 % (ref 36.0–46.0)
Hemoglobin: 13.8 g/dL (ref 12.0–15.0)
MCHC: 34.2 g/dL (ref 30.0–36.0)
MCV: 86.7 fL (ref 78.0–100.0)
Platelets: 157 10*3/uL (ref 150–400)
RBC: 4.64 MIL/uL (ref 3.87–5.11)
RDW: 16.1 % — ABNORMAL HIGH (ref 11.5–15.5)
WBC: 6.6 10*3/uL (ref 4.0–10.5)

## 2010-05-03 LAB — DIFFERENTIAL
Basophils Absolute: 0 10*3/uL (ref 0.0–0.1)
Basophils Relative: 1 % (ref 0–1)
Eosinophils Absolute: 0.1 10*3/uL (ref 0.0–0.7)
Eosinophils Relative: 1 % (ref 0–5)
Lymphocytes Relative: 23 % (ref 12–46)
Lymphs Abs: 1.5 10*3/uL (ref 0.7–4.0)
Monocytes Absolute: 0.4 10*3/uL (ref 0.1–1.0)
Monocytes Relative: 7 % (ref 3–12)
Neutro Abs: 4.5 10*3/uL (ref 1.7–7.7)
Neutrophils Relative %: 69 % (ref 43–77)

## 2010-05-03 LAB — FERRITIN: Ferritin: 41 ng/mL (ref 10–291)

## 2010-05-06 LAB — FERRITIN: Ferritin: 5 ng/mL — ABNORMAL LOW (ref 10–291)

## 2010-05-06 LAB — COMPREHENSIVE METABOLIC PANEL
ALT: 14 U/L (ref 0–35)
AST: 18 U/L (ref 0–37)
Albumin: 4.2 g/dL (ref 3.5–5.2)
Alkaline Phosphatase: 48 U/L (ref 39–117)
BUN: 13 mg/dL (ref 6–23)
CO2: 30 mEq/L (ref 19–32)
Calcium: 9.5 mg/dL (ref 8.4–10.5)
Chloride: 107 mEq/L (ref 96–112)
Creatinine, Ser: 0.78 mg/dL (ref 0.4–1.2)
GFR calc Af Amer: >60 mL/min (ref >60)
GFR calc non Af Amer: >60 mL/min (ref >60)
Glucose, Bld: 89 mg/dL (ref 70–99)
Potassium: 3.8 mEq/L (ref 3.5–5.1)
Sodium: 140 mEq/L (ref 135–145)
Total Bilirubin: 0.7 mg/dL (ref 0.3–1.2)
Total Protein: 6.7 g/dL (ref 6.0–8.3)

## 2010-05-06 LAB — RETICULOCYTES
RBC.: 4.49 MIL/uL (ref 3.87–5.11)
Retic Count, Absolute: 44.9 10*3/uL (ref 19.0–186.0)
Retic Ct Pct: 1 % (ref 0.4–3.1)

## 2010-05-06 LAB — LIPID PANEL
Cholesterol: 159 mg/dL (ref 0–200)
HDL: 57 mg/dL (ref >39)
LDL Cholesterol: 94 mg/dL (ref 0–99)
Total CHOL/HDL Ratio: 2.8 RATIO
Triglycerides: 41 mg/dL (ref <150)
VLDL: 8 mg/dL (ref 0–40)

## 2010-05-06 LAB — DIFFERENTIAL
Basophils Absolute: 0 10*3/uL (ref 0.0–0.1)
Basophils Relative: 1 % (ref 0–1)
Eosinophils Absolute: 0.1 10*3/uL (ref 0.0–0.7)
Eosinophils Relative: 3 % (ref 0–5)
Lymphocytes Relative: 34 % (ref 12–46)
Lymphs Abs: 1.3 10*3/uL (ref 0.7–4.0)
Monocytes Absolute: 0.4 10*3/uL (ref 0.1–1.0)
Monocytes Relative: 10 % (ref 3–12)
Neutro Abs: 2 10*3/uL (ref 1.7–7.7)
Neutrophils Relative %: 52 % (ref 43–77)

## 2010-05-06 LAB — CBC
HCT: 35.7 % — ABNORMAL LOW (ref 36.0–46.0)
Hemoglobin: 12.1 g/dL (ref 12.0–15.0)
MCHC: 34.1 g/dL (ref 30.0–36.0)
MCV: 83.5 fL (ref 78.0–100.0)
Platelets: 152 10*3/uL (ref 150–400)
RBC: 4.27 MIL/uL (ref 3.87–5.11)
RDW: 13.5 % (ref 11.5–15.5)
WBC: 3.8 10*3/uL — ABNORMAL LOW (ref 4.0–10.5)

## 2010-05-06 LAB — VITAMIN B12: Vitamin B-12: 251 pg/mL (ref 211–911)

## 2010-05-06 LAB — IRON AND TIBC
Iron: 40 ug/dL — ABNORMAL LOW (ref 42–135)
Saturation Ratios: 10 % — ABNORMAL LOW (ref 20–55)
TIBC: 413 ug/dL (ref 250–470)
UIBC: 373 ug/dL

## 2010-05-06 LAB — FOLATE: Folate: >20 ng/mL

## 2010-05-15 ENCOUNTER — Encounter: Payer: Self-pay | Admitting: Orthopedic Surgery

## 2010-05-16 ENCOUNTER — Ambulatory Visit (INDEPENDENT_AMBULATORY_CARE_PROVIDER_SITE_OTHER): Payer: BC Managed Care – PPO | Admitting: Orthopedic Surgery

## 2010-05-16 DIAGNOSIS — M2240 Chondromalacia patellae, unspecified knee: Secondary | ICD-10-CM

## 2010-05-16 NOTE — Patient Instructions (Addendum)
Take the ibuprofen for 2 consecutive weeks   3 x a day

## 2010-05-16 NOTE — Progress Notes (Signed)
Chief complaint RIGHT knee pain.  History of LEFT knee pain as well, which has fairly improved with activity modification. MRI was done, and it shows normal menisci, inflammatory changes in the gastroc chondromalacia of the lateral femoral condyle, lateral patellar facet.  However, at this time. She is having anterior knee pain.  She does take ibuprofen 600 mg once a day on a p.r.n. Basis.  Her pain appears to increase when she is ambulating or extending her knee.  Exam RIGHT knee Patellofemoral crepitance patellofemoral pain, positive compression test, positive quadriceps contraction test.  Patellar tendon, nontender. Joint lines nontender. Range of motion normal. Ligaments stable. McMurray sign. Negative.  Impression patellofemoral arthritis.  We did offer injection. The patient would rather try noninvasive measures.  Recommend ibuprofen 600 mg 3 times a day for 2 weeks consecutive if not improved will call to get scheduled for injection

## 2010-06-16 NOTE — Op Note (Signed)
[place]  Patient:    Kaitlyn Morrison, Kaitlyn Morrison                       MRN: [id number] Proc. Date: 08/03/99 Adm. Date:  [id number] Attending:  Melody Comas.                           Operative Report  PREOPERATIVE DIAGNOSIS:  Right upper outer quadrant breast mass, status post right breast reconstruction with transverse rectus abdominis myocutaneous flap.  POSTOPERATIVE DIAGNOSIS:  Probable fat necrosis, right upper outer quadrant of the breast.  OPERATION PERFORMED:  SURGEON:  Janet Berlin. Dan Humphreys, M.D.  ANESTHESIA:  General.  INDICATIONS FOR PROCEDURE:  Kaitlyn Morrison is a little over nine months out from immediate right breast reconstruction with a TRAM flap.  She has developed an area of focal nodularity in the right upper outer quadrant of the reconstructed breast.  This is clinically felt to represent fat necrosis. However, this area is tender for her.  Additionally, I feel it is incumbent to make a diagnosis in this patient who had previously had breast cancer. Accordingly she is taken to the operating room at this time for excisional biopsy of the mass.  OPERATIVE FINDINGS:  The mass grossly appeared to be consistent with fat necrosis.  It did have a liquified core filled with creamy yellowish-white fluid.  Since I could not be absolutely certain whether or not this was an abscess, intraoperative cultures were taken.  DESCRIPTION OF PROCEDURE:  The patient was agreed in the preoperative holding area and then brought back to the operating room where she was placed on the table in the supine position.  General anesthesia was commenced without difficulty.  The patients right anterior chest and axilla were sterilely prepped and draped and continuity.  I entered the subcutaneous space by excising a portion of the scar along the right upper outer quadrant of the breast at the TRAM flap inset point.  While carefully trying to excise the scar in the  immediate subdermal location, yellowish-white fluid immediately flooded the field.  This was seen to come from a cavity within the mass proper.  I took intraoperative cultures at this point although my suspicion ws fairly low that this represented an abscess.  Rather, I felt that this probably represented liquifaction fat necrosis.  The main body of the mass was sharply dissected free from all the surrounding soft tissues.  This was facilitated using Metzenbaum scissors and curved Mayo scissors while an assistant held retractors. Deeply, the mass went all the way down to the lateral border of the pectoralis major muscle.  After removing the main portion of the mass, I found a couple of additional noduled which I resected and added to the specimen.  This specimen was passed off the operative field for permanent sections.  Individual bleeding points were controlled with electrocautery. Visually and by palpation, I could find no other areas that needed to be resected.  I would note at this point that the mass measured approximately 5 cm in maximal diameter.  The pulsatile lavage system was then brought on to the operative field.  I copiously irrigated the wound using 3L of irrigant.  Through a small separate stab incision laterally and inferiorly, I introduced a 3/4 fluted Blake drain. This was secured externally with a single interrupted suture of 3-0 Monofilament Prolene.  A final check was made to reconfirm hemostasis.  The wound was then closed in layers.  I used interrupted, buried 4-0 Vicryl sutures for wound edge approximation followed by a running subcuticular suture of 4-0 Monocryl for final epidermal leveling.  Steri-Strips were placed to reinforce the wound closure.  The wound was sterilely dressed and the patient allowed to emerge from anesthesia.  She was successfully extubated and taken to the post anesthesia care unit, leaving the operating room in stable and satisfactory  condition.  Estimated blood loss for this procedure was approximately 20 to 30 cc.  There were no apparent operative or anesthetic complications. DD:  08/03/99 TD:  08/03/99 Job: [id number]

## 2010-06-16 NOTE — Consult Note (Signed)
NAMEEULALAH, Kaitlyn Morrison              ACCOUNT NO.:  [id number]   MEDICAL RECORD NO.:  [id number]          PATIENT TYPE:  AMB   LOCATION:  DAY                           FACILITY:  [facility]   PHYSICIAN:  Lionel December, M.D.    DATE OF BIRTH:  [birth year]   DATE OF CONSULTATION:  DATE OF DISCHARGE:                                   CONSULTATION   REASON FOR CONSULTATION:  Rectal bleeding.   HISTORY OF PRESENT ILLNESS:  Mrs. Kaitlyn Morrison is a 55 year old Caucasian female  with history of breast carcinoma diagnosed in August of 2000.  She had DCIS  stage I.  She is status post right mastectomy in September 2005.  She  reports a three week history of burgundy blood noted on the toilet paper in  moderate amounts on at least 2-3 occasions.  She does have occasional  constipation with a bowel movement every 2-3 days, but denies any  significant straining.  She does have known hemorrhoids with occasional  proctalgia and pruritus, but none recently.  She notes the bleeding is  different than when she has intermittent spotting with her hemorrhoids.  She  denies any abdominal pain.  Denies any history of diarrhea.  Her brother and  father have had adenomatous polyps as well.   CURRENT MEDICATIONS:  1.  Calcium 600 mg t.i.d.  2.  Vitamin D 400 IU daily.  3.  Delsym cough p.r.n.  4.  Benzonatate 100 mg q.i.d.  5.  Nexium 40 mg b.i.d.  6.  Lorazepam 0.5 mg daily.  7.  Megestrol 40 mg one half to one tablet daily.  8.  Lipitor 10 mg daily.  9.  Ibuprofen infrequently.   ALLERGIES:  IVP DYE.   FAMILY HISTORY:  Positive for adenomatous polyps in a brother and father.  Mother age 53 is relatively healthy.  Father age 27 has coronary artery  disease.   SOCIAL HISTORY:  Mrs. Kaitlyn Morrison has been married for 19 years.  She is  employed full time with [employer] in [place].  She denies any  tobacco use.  She drinks an occasional glass of wine, about once a month.  Denies any drug use.   REVIEW OF  SYSTEMS:  CONSTITUTIONAL:  Weight is stable.  Appetite is good.  Denies any fatigue or insomnia.  CARDIOVASCULAR:  No chest pain or  palpitations.  PULMONARY:  She does have frequent cough which is  nonproductive.  She is being sent to pulmonologist by Dr. Sudie Bailey for  further evaluation.  GI:  See HPI.  She denies any nausea or vomiting,  heartburn, indigestion, dysphagia or odynophagia.   PHYSICAL EXAMINATION:  VITAL SIGNS:  Weight 132 pounds, height 64 inches,  temperature 98 degrees, blood pressure 118/78, pulse 88.  GENERAL APPEARANCE:  Mrs. Kaitlyn Morrison is a 55 year old Caucasian female who is  alert, pleasant and cooperative in no acute distress.  HEENT:  Sclerae clear, nonicteric.  Conjunctivae pink.  Oropharynx pink and  moist without lesions.  NECK:  Supple without any mass or thyromegaly.  CHEST:  Heart regular rate and rhythm with normal S1, S2 without  murmurs,  clicks, thrills or gallops.  LUNGS:  Clear to auscultation bilaterally.  ABDOMEN:  She does have a well-healed, low transverse large well-healed scar  from previous reconstructive breast surgery.  Positive bowel sounds x4.  No  bruits auscultated.  Soft, nontender, nondistended without palpable mass, or  hepatosplenomegaly.  RECTAL:  Hemorrhoid at 6 o'clock which is less than 1 cm and protruding  without active bleeding or erythema.  Internal exam reveals good sphincter  tone.  No internal mass was palpated.  Small amount of formed light brown  stool is obtained from the vault which is hemoccult negative.  EXTREMITIES:  Without edema bilaterally.  No clubbing.  SKIN:  Pink, warm and dry without any rash or jaundice.   ASSESSMENT:  Mrs. Kaitlyn Morrison is a 55 year old Caucasian female with a personal  history of breast carcinoma and a family history of adenomatous colon polyps  who presents with a three week history of intermittent moderate volume,  hematochezia on 2-3 occasions.  She also has a history of intermittent   constipation as well.  Given her age and history of breast cancer as well as  family history of colonic adenomatous polyps, I feel she should undergo  colonoscopy to rule out colonic polyps or neoplasia.   RECOMMENDATIONS:  1.  Will schedule colonoscopy with Dr. Karilyn Cota in the near future.  I have      discussed this procedure including risks and benefits to include but not      limited to bleeding, infection, perforation, drug reaction.  He agrees      with the plan and consent will be obtained.  2.  I have also given HC-Forte one kit per rectum b.i.d. x20 days and one      sample was given.  3.  Recommended Colace stool softener 1-2 daily.  4.  Recommended fiber and have given her samples of Fiber Choice 1-2 tablets      daily.   We would like to thank Dr. Sudie Bailey for allowing [place] to participate in the  care of Ms. Kaitlyn Morrison.      KC/MEDQ  D:  06/21/2004  T:  06/21/2004  Job:  [id number]

## 2010-06-16 NOTE — Op Note (Signed)
Kaitlyn Morrison, Kaitlyn Morrison              ACCOUNT NO.:  [id number]   MEDICAL RECORD NO.:  [id number]          PATIENT TYPE:  AMB   LOCATION:  DAY                           FACILITY:  [facility]   PHYSICIAN:  Lionel December, M.D.    DATE OF BIRTH:  01/05/1956   DATE OF PROCEDURE:  07/04/2004  DATE OF DISCHARGE:                                 OPERATIVE REPORT   PROCEDURE:  Colonoscopy.   INDICATIONS:  Kaitlyn Morrison is a 55 year old Caucasian female who recently had  rectal bleeding when she passed burgundy blood in moderate amount at least  two or three occasions. She has not had any more bleeding. She has  occasional constipation. Family history is positive in the fact that her  father and older brother both have had polyps. She is undergoing diagnostic  colonoscopy. Personal history is positive for breast carcinoma, and she  remains in remission. Procedure risks were reviewed with the patient, and  informed consent was obtained.   PREMEDICATION:  Demerol 50 mg IV, Versed 7 mg IV in divided dose.   FINDINGS:  Procedure performed in endoscopy suite. The patient's vital signs  and O2 saturation were monitored during the procedure remained stable. The  patient was placed in left lateral position and rectal examination  performed. No abnormality noted on external or digital exam. Olympus  videoscope was placed in rectum and advanced under vision in sigmoid colon  and beyond. Preparation was excellent. Scope was passed to the cecum which  was identified by ileocecal valve and appendiceal orifice. Pictures taken  for the record. As the scope was withdrawn, colonic mucosa was carefully  examined. It was normal except there was a tiny polyp at distal sigmoid  colon which was ablated via cold biopsy. Rectal mucosa was normal. Scope was  retroflexed to examine anorectal junction. There was some thickening to  mucosa of the canal, but no other abnormalities were noted. Scope was passed  into cecum which was  identified by ileocecal valve and appendiceal orifice.  As the scope was withdrawn, colonic mucosa was carefully examined. There was  a single small diverticulum at the sigmoid colon along with a tiny polyp  below that. There was a small diverticulum at sigmoid colon. There was a  tiny polyp at distal sigmoid colon which was ablated via cold biopsy. Rectal  mucosa was normal. Scope was retroflexed to examine anorectal junction, and  there was some thickening to the anal canal mucosa, otherwise normal. Scope  was straightened and withdrawn. The patient tolerated the procedure well.   FINAL DIAGNOSIS:  1.  Single small diverticulum at sigmoid colon.  2.  A small polyp at distal sigmoid colon which was ablated via cold biopsy.   RECOMMENDATIONS:  She will resume her usual medications. I will be  contacting the patient with biopsy results and further recommendations.       NR/MEDQ  D:  07/04/2004  T:  07/04/2004  Job:  [id number]   cc:   Mila Homer. Sudie Bailey, M.D.  [address] [place], [place] [address]  Fax: [phone]

## 2010-07-12 ENCOUNTER — Encounter (HOSPITAL_COMMUNITY): Payer: Self-pay

## 2010-07-12 DIAGNOSIS — M778 Other enthesopathies, not elsewhere classified: Secondary | ICD-10-CM | POA: Insufficient documentation

## 2010-07-19 ENCOUNTER — Encounter (HOSPITAL_COMMUNITY): Payer: Self-pay | Admitting: Oncology

## 2010-07-19 ENCOUNTER — Other Ambulatory Visit (HOSPITAL_COMMUNITY): Payer: Self-pay | Admitting: Oncology

## 2010-07-19 DIAGNOSIS — D509 Iron deficiency anemia, unspecified: Secondary | ICD-10-CM

## 2010-07-19 DIAGNOSIS — I2699 Other pulmonary embolism without acute cor pulmonale: Secondary | ICD-10-CM

## 2010-07-19 DIAGNOSIS — D0510 Intraductal carcinoma in situ of unspecified breast: Secondary | ICD-10-CM | POA: Insufficient documentation

## 2010-07-19 HISTORY — DX: Other pulmonary embolism without acute cor pulmonale: I26.99

## 2010-07-19 HISTORY — DX: Iron deficiency anemia, unspecified: D50.9

## 2010-08-07 ENCOUNTER — Other Ambulatory Visit (HOSPITAL_COMMUNITY): Payer: Self-pay | Admitting: Oncology

## 2010-08-10 ENCOUNTER — Other Ambulatory Visit (HOSPITAL_COMMUNITY): Payer: Self-pay | Admitting: Oncology

## 2010-08-10 ENCOUNTER — Encounter (HOSPITAL_COMMUNITY): Payer: BC Managed Care – PPO | Attending: Oncology

## 2010-08-10 DIAGNOSIS — Z853 Personal history of malignant neoplasm of breast: Secondary | ICD-10-CM | POA: Insufficient documentation

## 2010-08-10 DIAGNOSIS — Z79899 Other long term (current) drug therapy: Secondary | ICD-10-CM | POA: Insufficient documentation

## 2010-08-10 DIAGNOSIS — D509 Iron deficiency anemia, unspecified: Secondary | ICD-10-CM | POA: Insufficient documentation

## 2010-08-10 LAB — DIFFERENTIAL
Basophils Absolute: 0 10*3/uL (ref 0.0–0.1)
Basophils Relative: 0 % (ref 0–1)
Eosinophils Absolute: 0.1 10*3/uL (ref 0.0–0.7)
Eosinophils Relative: 3 % (ref 0–5)
Lymphocytes Relative: 30 % (ref 12–46)
Lymphs Abs: 1.4 10*3/uL (ref 0.7–4.0)
Monocytes Absolute: 0.4 10*3/uL (ref 0.1–1.0)
Monocytes Relative: 8 % (ref 3–12)
Neutro Abs: 2.8 10*3/uL (ref 1.7–7.7)
Neutrophils Relative %: 59 % (ref 43–77)

## 2010-08-10 LAB — COMPREHENSIVE METABOLIC PANEL
ALT: 23 U/L (ref 0–35)
AST: 18 U/L (ref 0–37)
Albumin: 4 g/dL (ref 3.5–5.2)
Alkaline Phosphatase: 50 U/L (ref 39–117)
BUN: 20 mg/dL (ref 6–23)
CO2: 33 mEq/L — ABNORMAL HIGH (ref 19–32)
Calcium: 9.7 mg/dL (ref 8.4–10.5)
Chloride: 103 mEq/L (ref 96–112)
Creatinine, Ser: 0.67 mg/dL (ref 0.50–1.10)
GFR calc Af Amer: >60 mL/min (ref >60)
GFR calc non Af Amer: >60 mL/min (ref >60)
Glucose, Bld: 88 mg/dL (ref 70–99)
Potassium: 3.9 mEq/L (ref 3.5–5.1)
Sodium: 142 mEq/L (ref 135–145)
Total Bilirubin: 0.4 mg/dL (ref 0.3–1.2)
Total Protein: 7.3 g/dL (ref 6.0–8.3)

## 2010-08-10 LAB — CBC
HCT: 42.7 % (ref 36.0–46.0)
Hemoglobin: 14.3 g/dL (ref 12.0–15.0)
MCH: 30.2 pg (ref 26.0–34.0)
MCHC: 33.5 g/dL (ref 30.0–36.0)
MCV: 90.3 fL (ref 78.0–100.0)
Platelets: 177 10*3/uL (ref 150–400)
RBC: 4.73 MIL/uL (ref 3.87–5.11)
RDW: 12.4 % (ref 11.5–15.5)
WBC: 4.8 10*3/uL (ref 4.0–10.5)

## 2010-08-10 LAB — LIPID PANEL
Cholesterol: 191 mg/dL (ref 0–200)
HDL: 62 mg/dL (ref >39)
LDL Cholesterol: 115 mg/dL — ABNORMAL HIGH (ref 0–99)
Total CHOL/HDL Ratio: 3.1 RATIO
Triglycerides: 68 mg/dL (ref <150)
VLDL: 14 mg/dL (ref 0–40)

## 2010-08-10 LAB — FERRITIN: Ferritin: 307 ng/mL — ABNORMAL HIGH (ref 10–291)

## 2010-08-11 ENCOUNTER — Encounter (HOSPITAL_COMMUNITY): Payer: BC Managed Care – PPO | Admitting: Oncology

## 2010-08-11 ENCOUNTER — Encounter (HOSPITAL_COMMUNITY): Payer: Self-pay | Admitting: Oncology

## 2010-08-11 DIAGNOSIS — D0510 Intraductal carcinoma in situ of unspecified breast: Secondary | ICD-10-CM

## 2010-08-11 DIAGNOSIS — D509 Iron deficiency anemia, unspecified: Secondary | ICD-10-CM

## 2010-08-11 DIAGNOSIS — E7800 Pure hypercholesterolemia, unspecified: Secondary | ICD-10-CM

## 2010-08-11 NOTE — Progress Notes (Signed)
This office note has been dictated.

## 2010-08-11 NOTE — Progress Notes (Signed)
CC:   Kaitlyn Morrison. Kaitlyn Morrison, M.D. Kaitlyn Morrison, M.D.  DIAGNOSES: 1. Ductal carcinoma in situ of the right breast, status post     mastectomy on the right in August 2000 followed by Dr. Francina Ames     with a transverse rectus abdominis myocutaneous reconstruction by     Dr. Desmond Dike.  She has had no evidence recurrence. 2. Iron-deficiency anemia due to both gastrointestinal blood losses     from reflux esophagitis, treated by Dr. Viann Shove at [facility]     and Dr. Lionel Morrison, as well as inadequate absorption with a     Given capsule camera study done on 05/26/2009. 3. Hyperlipidemia on Lipitor 10 mg a day with excellent results and no     toxicity to her liver, and her cholesterol to HDL ratio is well     under the average range. 4. Pulmonary emboli in the setting of a surgery by years ago.  No     evidence of recurrence since, and no family history. 5. Intravenous pyelogram dye allergy which gives her nausea and     swelling.  Her labs the other day showed a normal hemoglobin, normal hematocrit, normal white count and platelets, and normal liver enzymes.  Her total cholesterol was well within the normal range.  The LDL cholesterol was only minimally elevated.  Her ferritin had risen from 32-307.  So she is doing very, very well.  She is also due for a bone density in the next year.  We will set that up for 6 months.  She has been taking Fosamax from Dr. Sudie Morrison, but really has been taking calcium with vitamin D. She will start taking 1 calcium a day 600 mg and 1000-2000 units of vitamin D per day.  Her other labs have been quite good as I mentioned, kidney function, calcium, etc.  She has otherwise done well.  REVIEW OF SYSTEMS:  Oncologically is negative.  PHYSICAL EXAMINATION:  Her exam shows stable vital signs.  No lymphadenopathy.  Reconstructed breast on the right is excellent.  Left breast is negative for masses.  Lungs are clear to auscultation and percussion.   Heart shows regular rhythm and rate without murmur, rub, or gallop.  Abdomen is soft and nontender without organomegaly.  Bowel sounds are normal.  She has no peripheral edema of the arms or legs. She is alert and oriented.  Nail color is also intact.  ASSESSMENT AND PLAN:  So we will see her back in a year, sooner if need be.  She will come back in 6 months for cholesterol check and CMET.  We will check her ferritin, CBC, and CMET in a year.   ______________________________ Kaitlyn Morrison. Kaitlyn Sleet, MD ESN/MEDQ  D:  08/11/2010  T:  08/11/2010  Job:  [id number]

## 2010-08-11 NOTE — Patient Instructions (Signed)
[facility]  Discharge Instructions  RECOMMENDATIONS MADE BY THE CONSULTANT AND ANY TEST RESULTS WILL BE SENT TO YOUR REFERRING DOCTOR.   EXAM FINDINGS BY MD TODAY AND SIGNS AND SYMPTOMS TO REPORT TO CLINIC OR PRIMARY MD: Let us know if sleep becomes more of an issue  MEDICATIONS PRESCRIBED: none INSTRUCTIONS GIVEN AND DISCUSSED: Other  Labs in January and then again next July  SPECIAL INSTRUCTIONS/FOLLOW-UP: Return to Clinic on  One year   I acknowledge that I have been informed and understand all the instructions given to me and received a copy. I do not have any more questions at this time, but understand that I may call the Specialty Clinic at [facility] at [phone] during business hours should I have any further questions or need assistance in obtaining follow-up care.    __________________________________________  _____________  __________ Signature of Patient or Authorized Representative            Date                   Time    __________________________________________ Nurse's Signature

## 2010-10-18 LAB — COMPREHENSIVE METABOLIC PANEL
ALT: 41 — ABNORMAL HIGH
AST: 26
Albumin: 3.9
Alkaline Phosphatase: 61
BUN: 16
CO2: 25
Calcium: 9.7
Chloride: 105
Creatinine, Ser: 0.89
GFR calc Af Amer: >60
GFR calc non Af Amer: >60
Glucose, Bld: 101 — ABNORMAL HIGH
Potassium: 3.7
Sodium: 139
Total Bilirubin: 0.8
Total Protein: 6.3

## 2010-10-18 LAB — LIPID PANEL
Cholesterol: 241 — ABNORMAL HIGH
HDL: 73
LDL Cholesterol: 158 — ABNORMAL HIGH
Total CHOL/HDL Ratio: 3.3
Triglycerides: 52
VLDL: 10

## 2010-10-26 LAB — LIPID PANEL
Cholesterol: 163
HDL: 71
LDL Cholesterol: 84
Total CHOL/HDL Ratio: 2.3
Triglycerides: 42
VLDL: 8

## 2010-10-26 LAB — CBC
HCT: 34.2 — ABNORMAL LOW
Hemoglobin: 11.6 — ABNORMAL LOW
MCHC: 33.8
MCV: 76.5 — ABNORMAL LOW
Platelets: 179
RBC: 4.47
RDW: 17.1 — ABNORMAL HIGH
WBC: 4.8

## 2010-10-26 LAB — COMPREHENSIVE METABOLIC PANEL
ALT: 22
AST: 23
Albumin: 3.9
Alkaline Phosphatase: 55
BUN: 11
CO2: 30
Calcium: 9.4
Chloride: 105
Creatinine, Ser: 0.81
GFR calc Af Amer: >60
GFR calc non Af Amer: >60
Glucose, Bld: 94
Potassium: 4.3
Sodium: 139
Total Bilirubin: 0.7
Total Protein: 6.2

## 2010-11-02 LAB — COMPREHENSIVE METABOLIC PANEL
ALT: 15 U/L (ref 0–35)
AST: 18 U/L (ref 0–37)
Albumin: 4.2 g/dL (ref 3.5–5.2)
Alkaline Phosphatase: 43 U/L (ref 39–117)
BUN: 15 mg/dL (ref 6–23)
CO2: 30 mEq/L (ref 19–32)
Calcium: 9.3 mg/dL (ref 8.4–10.5)
Chloride: 106 mEq/L (ref 96–112)
Creatinine, Ser: 0.84 mg/dL (ref 0.4–1.2)
GFR calc Af Amer: >60 mL/min (ref >60)
GFR calc non Af Amer: >60 mL/min (ref >60)
Glucose, Bld: 96 mg/dL (ref 70–99)
Potassium: 3.8 mEq/L (ref 3.5–5.1)
Sodium: 137 mEq/L (ref 135–145)
Total Bilirubin: 0.7 mg/dL (ref 0.3–1.2)
Total Protein: 6.5 g/dL (ref 6.0–8.3)

## 2010-11-02 LAB — DIFFERENTIAL
Band Neutrophils: 0 % (ref 0–10)
Basophils Absolute: 0.1 10*3/uL (ref 0.0–0.1)
Basophils Relative: 3 % — ABNORMAL HIGH (ref 0–1)
Blasts: 0 %
Eosinophils Absolute: 0.1 10*3/uL (ref 0.0–0.7)
Eosinophils Relative: 2 % (ref 0–5)
Lymphocytes Relative: 39 % (ref 12–46)
Lymphs Abs: 1.7 10*3/uL (ref 0.7–4.0)
Metamyelocytes Relative: 0 %
Monocytes Absolute: 0.3 10*3/uL (ref 0.1–1.0)
Monocytes Relative: 6 % (ref 3–12)
Myelocytes: 0 %
Neutro Abs: 2.1 10*3/uL (ref 1.7–7.7)
Neutrophils Relative %: 50 % (ref 43–77)
Promyelocytes Absolute: 0 %
nRBC: 0 /100 WBC

## 2010-11-02 LAB — CBC
HCT: 40.8 % (ref 36.0–46.0)
Hemoglobin: 13.6 g/dL (ref 12.0–15.0)
MCHC: 33.4 g/dL (ref 30.0–36.0)
MCV: 83.2 fL (ref 78.0–100.0)
RBC: 4.9 MIL/uL (ref 3.87–5.11)
RDW: 15.1 % (ref 11.5–15.5)
WBC: 4.3 10*3/uL (ref 4.0–10.5)

## 2010-11-02 LAB — LIPID PANEL
Cholesterol: 181 mg/dL (ref 0–200)
HDL: 68 mg/dL (ref >39)
LDL Cholesterol: 103 mg/dL — ABNORMAL HIGH (ref 0–99)
Total CHOL/HDL Ratio: 2.7 RATIO
Triglycerides: 51 mg/dL (ref <150)
VLDL: 10 mg/dL (ref 0–40)

## 2010-11-14 LAB — DIFFERENTIAL
Basophils Absolute: 0
Basophils Relative: 1
Eosinophils Absolute: 0.1
Eosinophils Relative: 3
Lymphocytes Relative: 28
Lymphs Abs: 1.3
Monocytes Absolute: 0.3
Monocytes Relative: 7
Neutro Abs: 3
Neutrophils Relative %: 62

## 2010-11-14 LAB — COMPREHENSIVE METABOLIC PANEL
ALT: 16
AST: 16
Albumin: 4.2
Alkaline Phosphatase: 62
BUN: 12
CO2: 25
Calcium: 9.3
Chloride: 108
Creatinine, Ser: 0.95
GFR calc Af Amer: >60
GFR calc non Af Amer: >60
Glucose, Bld: 96
Potassium: 4
Sodium: 140
Total Bilirubin: 1
Total Protein: 6.5

## 2010-11-14 LAB — LIPID PANEL
Cholesterol: 142
HDL: 45
LDL Cholesterol: 89
Total CHOL/HDL Ratio: 3.2
Triglycerides: 39
VLDL: 8

## 2010-11-14 LAB — CBC
HCT: 40.7
Hemoglobin: 13.8
MCHC: 33.8
MCV: 86.6
Platelets: 198
RBC: 4.7
RDW: 14
WBC: 4.8

## 2011-01-11 ENCOUNTER — Other Ambulatory Visit (HOSPITAL_COMMUNITY): Payer: Self-pay | Admitting: Oncology

## 2011-01-11 DIAGNOSIS — Z1231 Encounter for screening mammogram for malignant neoplasm of breast: Secondary | ICD-10-CM

## 2011-02-06 ENCOUNTER — Ambulatory Visit
Admission: RE | Admit: 2011-02-06 | Discharge: 2011-02-06 | Disposition: A | Discharge status: Home / Self Care | Payer: BC Managed Care – PPO | Source: Ambulatory Visit | Attending: Oncology | Admitting: Oncology

## 2011-02-06 DIAGNOSIS — Z1231 Encounter for screening mammogram for malignant neoplasm of breast: Secondary | ICD-10-CM

## 2011-02-12 ENCOUNTER — Other Ambulatory Visit (HOSPITAL_COMMUNITY): Payer: BC Managed Care – PPO

## 2011-02-13 ENCOUNTER — Encounter (HOSPITAL_COMMUNITY): Payer: BC Managed Care – PPO | Attending: Oncology

## 2011-02-13 DIAGNOSIS — E7800 Pure hypercholesterolemia, unspecified: Secondary | ICD-10-CM | POA: Insufficient documentation

## 2011-02-13 DIAGNOSIS — D0590 Unspecified type of carcinoma in situ of unspecified breast: Secondary | ICD-10-CM

## 2011-02-13 LAB — FERRITIN: Ferritin: 210 ng/mL (ref 10–291)

## 2011-02-13 LAB — COMPREHENSIVE METABOLIC PANEL
ALT: 23 U/L (ref 0–35)
AST: 18 U/L (ref 0–37)
Albumin: 4.1 g/dL (ref 3.5–5.2)
Alkaline Phosphatase: 50 U/L (ref 39–117)
BUN: 16 mg/dL (ref 6–23)
CO2: 31 mEq/L (ref 19–32)
Calcium: 9.8 mg/dL (ref 8.4–10.5)
Chloride: 105 mEq/L (ref 96–112)
Creatinine, Ser: 0.76 mg/dL (ref 0.50–1.10)
GFR calc Af Amer: >90 mL/min (ref >90)
GFR calc non Af Amer: >90 mL/min (ref >90)
Glucose, Bld: 87 mg/dL (ref 70–99)
Potassium: 3.7 mEq/L (ref 3.5–5.1)
Sodium: 143 mEq/L (ref 135–145)
Total Bilirubin: 0.4 mg/dL (ref 0.3–1.2)
Total Protein: 7 g/dL (ref 6.0–8.3)

## 2011-02-13 LAB — LIPID PANEL
Cholesterol: 188 mg/dL (ref 0–200)
HDL: 76 mg/dL (ref >39)
LDL Cholesterol: 101 mg/dL — ABNORMAL HIGH (ref 0–99)
Total CHOL/HDL Ratio: 2.5 RATIO
Triglycerides: 53 mg/dL (ref <150)
VLDL: 11 mg/dL (ref 0–40)

## 2011-02-13 LAB — CBC
HCT: 41.9 % (ref 36.0–46.0)
Hemoglobin: 14.1 g/dL (ref 12.0–15.0)
MCH: 29.9 pg (ref 26.0–34.0)
MCHC: 33.7 g/dL (ref 30.0–36.0)
MCV: 89 fL (ref 78.0–100.0)
Platelets: 186 10*3/uL (ref 150–400)
RBC: 4.71 MIL/uL (ref 3.87–5.11)
RDW: 12.7 % (ref 11.5–15.5)
WBC: 5 10*3/uL (ref 4.0–10.5)

## 2011-02-13 NOTE — Progress Notes (Signed)
Labs drawn today for lipid,cmp, (cbc and ferr. Added per Neijstrom ok)  Patient was fasting today.

## 2011-02-14 ENCOUNTER — Other Ambulatory Visit (HOSPITAL_COMMUNITY): Payer: Self-pay

## 2011-02-14 DIAGNOSIS — D0510 Intraductal carcinoma in situ of unspecified breast: Secondary | ICD-10-CM

## 2011-02-14 DIAGNOSIS — D509 Iron deficiency anemia, unspecified: Secondary | ICD-10-CM

## 2011-02-14 DIAGNOSIS — E785 Hyperlipidemia, unspecified: Secondary | ICD-10-CM

## 2011-05-30 DIAGNOSIS — T148XXA Other injury of unspecified body region, initial encounter: Secondary | ICD-10-CM

## 2011-05-30 HISTORY — DX: Other injury of unspecified body region, initial encounter: T14.8XXA

## 2011-07-18 ENCOUNTER — Telehealth (HOSPITAL_COMMUNITY): Payer: Self-pay | Admitting: *Deleted

## 2011-07-18 ENCOUNTER — Other Ambulatory Visit (HOSPITAL_COMMUNITY): Payer: Self-pay | Admitting: Oncology

## 2011-07-18 DIAGNOSIS — D0510 Intraductal carcinoma in situ of unspecified breast: Secondary | ICD-10-CM

## 2011-07-18 NOTE — Telephone Encounter (Signed)
Order placed for her bone density

## 2011-07-25 ENCOUNTER — Ambulatory Visit (HOSPITAL_COMMUNITY)
Admission: RE | Admit: 2011-07-25 | Discharge: 2011-07-25 | Disposition: A | Discharge status: Home / Self Care | Payer: BC Managed Care – PPO | Source: Ambulatory Visit | Attending: Oncology | Admitting: Oncology

## 2011-07-25 DIAGNOSIS — D0510 Intraductal carcinoma in situ of unspecified breast: Secondary | ICD-10-CM

## 2011-07-25 DIAGNOSIS — D0590 Unspecified type of carcinoma in situ of unspecified breast: Secondary | ICD-10-CM | POA: Insufficient documentation

## 2011-07-25 DIAGNOSIS — Z780 Asymptomatic menopausal state: Secondary | ICD-10-CM | POA: Insufficient documentation

## 2011-07-26 ENCOUNTER — Other Ambulatory Visit (HOSPITAL_COMMUNITY): Payer: Self-pay | Admitting: Oncology

## 2011-07-26 DIAGNOSIS — D0510 Intraductal carcinoma in situ of unspecified breast: Secondary | ICD-10-CM

## 2011-08-07 ENCOUNTER — Telehealth (HOSPITAL_COMMUNITY): Payer: Self-pay | Admitting: *Deleted

## 2011-08-07 NOTE — Telephone Encounter (Signed)
Pt notified that bone density was good.

## 2011-08-08 ENCOUNTER — Encounter (HOSPITAL_COMMUNITY): Payer: BC Managed Care – PPO | Attending: Oncology

## 2011-08-08 DIAGNOSIS — Z87898 Personal history of other specified conditions: Secondary | ICD-10-CM | POA: Insufficient documentation

## 2011-08-08 DIAGNOSIS — E785 Hyperlipidemia, unspecified: Secondary | ICD-10-CM | POA: Insufficient documentation

## 2011-08-08 DIAGNOSIS — D0510 Intraductal carcinoma in situ of unspecified breast: Secondary | ICD-10-CM

## 2011-08-08 DIAGNOSIS — Z09 Encounter for follow-up examination after completed treatment for conditions other than malignant neoplasm: Secondary | ICD-10-CM | POA: Insufficient documentation

## 2011-08-08 DIAGNOSIS — Z91041 Radiographic dye allergy status: Secondary | ICD-10-CM | POA: Insufficient documentation

## 2011-08-08 DIAGNOSIS — K2100 Gastro-esophageal reflux disease with esophagitis, without bleeding: Secondary | ICD-10-CM | POA: Insufficient documentation

## 2011-08-08 DIAGNOSIS — D509 Iron deficiency anemia, unspecified: Secondary | ICD-10-CM | POA: Insufficient documentation

## 2011-08-08 DIAGNOSIS — Z86711 Personal history of pulmonary embolism: Secondary | ICD-10-CM | POA: Insufficient documentation

## 2011-08-08 LAB — COMPREHENSIVE METABOLIC PANEL
ALT: 16 U/L (ref 0–35)
AST: 18 U/L (ref 0–37)
Albumin: 4.4 g/dL (ref 3.5–5.2)
Alkaline Phosphatase: 46 U/L (ref 39–117)
BUN: 18 mg/dL (ref 6–23)
CO2: 27 mEq/L (ref 19–32)
Calcium: 10.1 mg/dL (ref 8.4–10.5)
Chloride: 103 mEq/L (ref 96–112)
Creatinine, Ser: 0.82 mg/dL (ref 0.50–1.10)
GFR calc Af Amer: >90 mL/min (ref >90)
GFR calc non Af Amer: 79 mL/min — ABNORMAL LOW (ref >90)
Glucose, Bld: 85 mg/dL (ref 70–99)
Potassium: 3.8 mEq/L (ref 3.5–5.1)
Sodium: 141 mEq/L (ref 135–145)
Total Bilirubin: 0.5 mg/dL (ref 0.3–1.2)
Total Protein: 6.9 g/dL (ref 6.0–8.3)

## 2011-08-08 LAB — CBC
HCT: 41.1 % (ref 36.0–46.0)
Hemoglobin: 14 g/dL (ref 12.0–15.0)
MCH: 30 pg (ref 26.0–34.0)
MCHC: 34.1 g/dL (ref 30.0–36.0)
MCV: 88.2 fL (ref 78.0–100.0)
Platelets: 188 10*3/uL (ref 150–400)
RBC: 4.66 MIL/uL (ref 3.87–5.11)
RDW: 12.9 % (ref 11.5–15.5)
WBC: 4.3 10*3/uL (ref 4.0–10.5)

## 2011-08-08 LAB — LIPID PANEL
Cholesterol: 180 mg/dL (ref 0–200)
HDL: 64 mg/dL (ref >39)
LDL Cholesterol: 97 mg/dL (ref 0–99)
Total CHOL/HDL Ratio: 2.8 RATIO
Triglycerides: 94 mg/dL (ref <150)
VLDL: 19 mg/dL (ref 0–40)

## 2011-08-08 LAB — DIFFERENTIAL
Basophils Absolute: 0 10*3/uL (ref 0.0–0.1)
Basophils Relative: 1 % (ref 0–1)
Eosinophils Absolute: 0.1 10*3/uL (ref 0.0–0.7)
Eosinophils Relative: 3 % (ref 0–5)
Lymphocytes Relative: 33 % (ref 12–46)
Lymphs Abs: 1.4 10*3/uL (ref 0.7–4.0)
Monocytes Absolute: 0.4 10*3/uL (ref 0.1–1.0)
Monocytes Relative: 9 % (ref 3–12)
Neutro Abs: 2.4 10*3/uL (ref 1.7–7.7)
Neutrophils Relative %: 55 % (ref 43–77)

## 2011-08-08 LAB — FERRITIN: Ferritin: 251 ng/mL (ref 10–291)

## 2011-08-08 NOTE — Progress Notes (Signed)
Labs drawn today for cbc/diff,cmp,lipid,ferr

## 2011-08-10 ENCOUNTER — Encounter (HOSPITAL_COMMUNITY): Payer: Self-pay | Admitting: Oncology

## 2011-08-10 ENCOUNTER — Encounter (HOSPITAL_BASED_OUTPATIENT_CLINIC_OR_DEPARTMENT_OTHER): Payer: BC Managed Care – PPO | Admitting: Oncology

## 2011-08-10 VITALS — BP 105/67 | HR 81 | Temp 97.6°F | Ht 64.0 in | Wt 131.5 lb

## 2011-08-10 DIAGNOSIS — D509 Iron deficiency anemia, unspecified: Secondary | ICD-10-CM

## 2011-08-10 DIAGNOSIS — D0590 Unspecified type of carcinoma in situ of unspecified breast: Secondary | ICD-10-CM

## 2011-08-10 DIAGNOSIS — E785 Hyperlipidemia, unspecified: Secondary | ICD-10-CM

## 2011-08-10 DIAGNOSIS — D0510 Intraductal carcinoma in situ of unspecified breast: Secondary | ICD-10-CM

## 2011-08-10 DIAGNOSIS — I2699 Other pulmonary embolism without acute cor pulmonale: Secondary | ICD-10-CM

## 2011-08-10 NOTE — Patient Instructions (Addendum)
Kaitlyn Morrison  [id number] [birth year] Dr. Glenford Peers   [facility]  Discharge Instructions  RECOMMENDATIONS MADE BY THE CONSULTANT AND ANY TEST RESULTS WILL BE SENT TO YOUR REFERRING DOCTOR.   EXAM FINDINGS BY MD TODAY AND SIGNS AND SYMPTOMS TO REPORT TO CLINIC OR PRIMARY MD: Exam and discussion per MD.  MEDICATIONS PRESCRIBED: none   INSTRUCTIONS GIVEN AND DISCUSSED: Other :  Report any new lumps, bone pain, shortness of breath, increased ice intake or increase fatigue.  SPECIAL INSTRUCTIONS/FOLLOW-UP: Lab work Needed in 6 months and 1 year and Return to Clinic after labs in 1 year.   I acknowledge that I have been informed and understand all the instructions given to me and received a copy. I do not have any more questions at this time, but understand that I may call the Specialty Clinic at [facility] at [phone] during business hours should I have any further questions or need assistance in obtaining follow-up care.    __________________________________________  _____________  __________ Signature of Patient or Authorized Representative            Date                   Time    __________________________________________ Nurse's Signature

## 2011-08-10 NOTE — Progress Notes (Signed)
Problem #1 DCIS of the right breast status post right simple mastectomy in August of 2000 by Dr. Maple Hudson with a transverse rectus abdominis myocutaneous reconstruction by Dr. Jillyn Hidden walker with no evidence of recurrence of the cancer  Problem #2 iron deficiency anemia due to reflux esophagitis and GI blood losses found by Dr. Karilyn Cota and Dr. Delford Field at [facility] revealing in adequate absorption in addition to a Giiven"s  capsule camera study on 05/26/2009  Problem #3 hyperlipidemia on Lipitor 10 mg a day x2010 years doing extremely well at this with no toxicity.  Problem #4 history of pulmonary emboli in the setting of surgery years ago no family history and no recurrence  Problem #5 IVP dye allergy giving her nausea and swelling  Kaitlyn Morrison is working hard but doing well with a negative review of systems. Vital signs are stable and recorded. Lymph nodes are negative throughout. The right reconstructed breast is negative. Her left breast is negative for masses. She has no adenopathy whatsoever. Lungs are clear. Heart shows a regular rhythm and rate without murmur rub or gallop. Abdomen soft nontender without hepatosplenomegaly. She has no leg edema no arm edema she is alert oriented.  We will see her in 6 months for a cmet and lipid panel and in a year for followup.

## 2011-08-30 ENCOUNTER — Other Ambulatory Visit (HOSPITAL_COMMUNITY): Payer: Self-pay | Admitting: Oncology

## 2011-08-30 DIAGNOSIS — Z0000 Encounter for general adult medical examination without abnormal findings: Secondary | ICD-10-CM

## 2012-01-16 ENCOUNTER — Other Ambulatory Visit (HOSPITAL_COMMUNITY): Payer: Self-pay | Admitting: Oncology

## 2012-01-16 DIAGNOSIS — Z1231 Encounter for screening mammogram for malignant neoplasm of breast: Secondary | ICD-10-CM

## 2012-02-07 ENCOUNTER — Ambulatory Visit: Payer: BC Managed Care – PPO

## 2012-02-11 ENCOUNTER — Other Ambulatory Visit (HOSPITAL_COMMUNITY): Payer: BC Managed Care – PPO

## 2012-02-12 ENCOUNTER — Encounter (HOSPITAL_COMMUNITY): Payer: BC Managed Care – PPO | Attending: Oncology

## 2012-02-12 DIAGNOSIS — E785 Hyperlipidemia, unspecified: Secondary | ICD-10-CM | POA: Insufficient documentation

## 2012-02-12 DIAGNOSIS — D0590 Unspecified type of carcinoma in situ of unspecified breast: Secondary | ICD-10-CM

## 2012-02-12 LAB — LIPID PANEL
Cholesterol: 188 mg/dL (ref 0–200)
HDL: 69 mg/dL (ref >39)
LDL Cholesterol: 106 mg/dL — ABNORMAL HIGH (ref 0–99)
Total CHOL/HDL Ratio: 2.7 RATIO
Triglycerides: 65 mg/dL (ref <150)
VLDL: 13 mg/dL (ref 0–40)

## 2012-02-12 LAB — COMPREHENSIVE METABOLIC PANEL
ALT: 15 U/L (ref 0–35)
AST: 18 U/L (ref 0–37)
Albumin: 4.2 g/dL (ref 3.5–5.2)
Alkaline Phosphatase: 49 U/L (ref 39–117)
BUN: 13 mg/dL (ref 6–23)
CO2: 28 mEq/L (ref 19–32)
Calcium: 9.5 mg/dL (ref 8.4–10.5)
Chloride: 103 mEq/L (ref 96–112)
Creatinine, Ser: 0.85 mg/dL (ref 0.50–1.10)
GFR calc Af Amer: 87 mL/min — ABNORMAL LOW (ref >90)
GFR calc non Af Amer: 75 mL/min — ABNORMAL LOW (ref >90)
Glucose, Bld: 89 mg/dL (ref 70–99)
Potassium: 3.8 mEq/L (ref 3.5–5.1)
Sodium: 140 mEq/L (ref 135–145)
Total Bilirubin: 0.5 mg/dL (ref 0.3–1.2)
Total Protein: 7.2 g/dL (ref 6.0–8.3)

## 2012-02-12 NOTE — Progress Notes (Signed)
Labs drawn today for cmp,lipid

## 2012-02-13 ENCOUNTER — Ambulatory Visit
Admission: RE | Admit: 2012-02-13 | Discharge: 2012-02-13 | Disposition: A | Discharge status: Home / Self Care | Payer: BC Managed Care – PPO | Source: Ambulatory Visit | Attending: Oncology | Admitting: Oncology

## 2012-02-13 DIAGNOSIS — Z1231 Encounter for screening mammogram for malignant neoplasm of breast: Secondary | ICD-10-CM

## 2012-07-28 ENCOUNTER — Other Ambulatory Visit (HOSPITAL_COMMUNITY): Payer: Self-pay | Admitting: Oncology

## 2012-08-04 ENCOUNTER — Other Ambulatory Visit (HOSPITAL_COMMUNITY): Payer: BC Managed Care – PPO

## 2012-08-05 ENCOUNTER — Other Ambulatory Visit (HOSPITAL_COMMUNITY): Payer: BC Managed Care – PPO

## 2012-08-08 ENCOUNTER — Ambulatory Visit (HOSPITAL_COMMUNITY): Payer: BC Managed Care – PPO | Admitting: Oncology

## 2012-08-11 ENCOUNTER — Other Ambulatory Visit (HOSPITAL_COMMUNITY): Payer: BC Managed Care – PPO

## 2012-08-13 ENCOUNTER — Encounter (HOSPITAL_COMMUNITY): Payer: BC Managed Care – PPO | Attending: Hematology and Oncology

## 2012-08-13 ENCOUNTER — Ambulatory Visit (HOSPITAL_COMMUNITY): Payer: BC Managed Care – PPO

## 2012-08-13 DIAGNOSIS — Z09 Encounter for follow-up examination after completed treatment for conditions other than malignant neoplasm: Secondary | ICD-10-CM | POA: Insufficient documentation

## 2012-08-13 DIAGNOSIS — E785 Hyperlipidemia, unspecified: Secondary | ICD-10-CM

## 2012-08-13 DIAGNOSIS — D0590 Unspecified type of carcinoma in situ of unspecified breast: Secondary | ICD-10-CM

## 2012-08-13 DIAGNOSIS — Z853 Personal history of malignant neoplasm of breast: Secondary | ICD-10-CM | POA: Insufficient documentation

## 2012-08-13 DIAGNOSIS — D0510 Intraductal carcinoma in situ of unspecified breast: Secondary | ICD-10-CM

## 2012-08-13 LAB — DIFFERENTIAL
Basophils Absolute: 0 10*3/uL (ref 0.0–0.1)
Basophils Relative: 0 % (ref 0–1)
Eosinophils Absolute: 0.2 10*3/uL (ref 0.0–0.7)
Eosinophils Relative: 4 % (ref 0–5)
Lymphocytes Relative: 26 % (ref 12–46)
Lymphs Abs: 1.2 10*3/uL (ref 0.7–4.0)
Monocytes Absolute: 0.4 10*3/uL (ref 0.1–1.0)
Monocytes Relative: 9 % (ref 3–12)
Neutro Abs: 2.9 10*3/uL (ref 1.7–7.7)
Neutrophils Relative %: 61 % (ref 43–77)

## 2012-08-13 LAB — COMPREHENSIVE METABOLIC PANEL
ALT: 20 U/L (ref 0–35)
AST: 19 U/L (ref 0–37)
Albumin: 4 g/dL (ref 3.5–5.2)
Alkaline Phosphatase: 46 U/L (ref 39–117)
BUN: 18 mg/dL (ref 6–23)
CO2: 29 mEq/L (ref 19–32)
Calcium: 9.6 mg/dL (ref 8.4–10.5)
Chloride: 105 mEq/L (ref 96–112)
Creatinine, Ser: 0.78 mg/dL (ref 0.50–1.10)
GFR calc Af Amer: >90 mL/min (ref >90)
GFR calc non Af Amer: >90 mL/min (ref >90)
Glucose, Bld: 102 mg/dL — ABNORMAL HIGH (ref 70–99)
Potassium: 3.7 mEq/L (ref 3.5–5.1)
Sodium: 142 mEq/L (ref 135–145)
Total Bilirubin: 0.4 mg/dL (ref 0.3–1.2)
Total Protein: 6.8 g/dL (ref 6.0–8.3)

## 2012-08-13 LAB — CBC
HCT: 41 % (ref 36.0–46.0)
Hemoglobin: 14.2 g/dL (ref 12.0–15.0)
MCH: 30.4 pg (ref 26.0–34.0)
MCHC: 34.6 g/dL (ref 30.0–36.0)
MCV: 87.8 fL (ref 78.0–100.0)
Platelets: 187 10*3/uL (ref 150–400)
RBC: 4.67 MIL/uL (ref 3.87–5.11)
RDW: 12.9 % (ref 11.5–15.5)
WBC: 4.7 10*3/uL (ref 4.0–10.5)

## 2012-08-13 LAB — LIPID PANEL
Cholesterol: 183 mg/dL (ref 0–200)
HDL: 70 mg/dL (ref >39)
LDL Cholesterol: 96 mg/dL (ref 0–99)
Total CHOL/HDL Ratio: 2.6 RATIO
Triglycerides: 85 mg/dL (ref <150)
VLDL: 17 mg/dL (ref 0–40)

## 2012-08-13 NOTE — Progress Notes (Signed)
Kaitlyn Morrison presented for labwork. Labs per MD order drawn via Peripheral Line 23 gauge needle inserted in LT AC  Good blood return present. Procedure without incident.  Needle removed intact. Patient tolerated procedure well.

## 2012-08-15 ENCOUNTER — Encounter (HOSPITAL_BASED_OUTPATIENT_CLINIC_OR_DEPARTMENT_OTHER): Payer: BC Managed Care – PPO

## 2012-08-15 ENCOUNTER — Ambulatory Visit (HOSPITAL_COMMUNITY): Payer: BC Managed Care – PPO

## 2012-08-15 ENCOUNTER — Encounter (HOSPITAL_COMMUNITY): Payer: Self-pay

## 2012-08-15 VITALS — BP 109/69 | HR 78 | Resp 14 | Wt 130.8 lb

## 2012-08-15 DIAGNOSIS — D0590 Unspecified type of carcinoma in situ of unspecified breast: Secondary | ICD-10-CM

## 2012-08-15 DIAGNOSIS — E785 Hyperlipidemia, unspecified: Secondary | ICD-10-CM

## 2012-08-15 DIAGNOSIS — D509 Iron deficiency anemia, unspecified: Secondary | ICD-10-CM

## 2012-08-15 DIAGNOSIS — D0511 Intraductal carcinoma in situ of right breast: Secondary | ICD-10-CM

## 2012-08-15 NOTE — Patient Instructions (Addendum)
[facility] Discharge Instructions  RECOMMENDATIONS MADE BY THE CONSULTANT AND ANY TEST RESULTS WILL BE SENT TO YOUR REFERRING PHYSICIAN.  EXAM FINDINGS BY THE PHYSICIAN TODAY AND SIGNS OR SYMPTOMS TO REPORT TO CLINIC OR PRIMARY PHYSICIAN: Exam and discussion by Dr. Sharia Reeve.  No evidence of recurrence by exam.  Continue with your mammograms.  MEDICATIONS PRESCRIBED:  none  INSTRUCTIONS GIVEN AND DISCUSSED: Report any new lumps, bone pain, shortness of breath or other symptoms.  SPECIAL INSTRUCTIONS/FOLLOW-UP: Follow-up in 1 year.  Thank you for choosing [facility] to provide your oncology and hematology care.  To afford each patient quality time with our providers, please arrive at least 15 minutes before your scheduled appointment time.  With your help, our goal is to use those 15 minutes to complete the necessary work-up to ensure our physicians have the information they need to help with your evaluation and healthcare recommendations.    Effective January 1st, 2014, we ask that you re-schedule your appointment with our physicians should you arrive 10 or more minutes late for your appointment.  We strive to give you quality time with our providers, and arriving late affects you and other patients whose appointments are after yours.    Again, thank you for choosing [facility].  Our hope is that these requests will decrease the amount of time that you wait before being seen by our physicians.       _____________________________________________________________  Should you have questions after your visit to [facility], please contact our office at [phone] between the hours of 8:30 a.m. and 5:00 p.m.  Voicemails left after 4:30 p.m. will not be returned until the following business day.  For prescription refill requests, have your pharmacy contact our office with your prescription refill request.

## 2012-08-15 NOTE — Progress Notes (Signed)
[facility] [phone]   [phone]  OFFICE PROGRESS NOTE  Milana Obey, MD [address] [place] [address]  DIAGNOSIS:  1.DCIS of the right breast  2.Hx of iron deficiency anemia    ONCOLOGIC HISTORY: Per Dr Thornton Papas last note;DCIS of the right breast status post right simple mastectomy in August of 2000 by Dr. Maple Hudson with a transverse rectus abdominis myocutaneous reconstruction by Dr. Jillyn Hidden walker with no evidence of recurrence of the cancer   Patient also has history of iron deficiency anemia due to reflux esophagitis and GI blood losses found by Dr. Karilyn Cota and Dr. Delford Field at [facility] revealing in adequate absorption in addition to a Giiven"s capsule camera study on 05/26/2009    INTERVAL HISTORY:   Kaitlyn Morrison 57 y.o. female returns to the clinic today for scheduled yearly follow up.she denies any new problems today.  She tells me she feels well denies shortness of breath, pain or fatigue .  She also denies any new breast lumps.  Her most recent mammogram in 01/2012 was read as Birads 2 benign. She also denies melena hematemesis or hematochezia.  DEXA scan in 2013 was essentially unremarkable.  MEDICAL HISTORY: Past Medical History  Diagnosis Date  . DCIS (ductal carcinoma in situ) of breast   . Iron deficiency   . PE (pulmonary embolism)   . Hyperlipidemia   . Osteopenia   . Diverticulosis   . Tendonitis of elbow, right     History of  . History of shingles   . 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 Iron deficiency anemia 07/19/2010  . Pulmonary embolism 07/19/2010  . Acid reflux   . Necrosis, breast fat   . Pars flaccida     lumbar back  . Pulled muscle may 2013    back    ALLERGIES:  is allergic to ethinyl estradiol; iohexol; and ivp dye.  MEDICATIONS:  Current Outpatient Prescriptions  Medication Sig Dispense Refill  . alendronate (FOSAMAX) 70 MG tablet 70 mg once a  week.       Marland Kitchen amitriptyline (ELAVIL) 10 MG tablet at bedtime.       Marland Kitchen atorvastatin (LIPITOR) 10 MG tablet Take 10 mg by mouth daily.       . IBUPROFEN PO Take 800 mg by mouth as needed.       Marland Kitchen LORazepam (ATIVAN) 0.5 MG tablet Take 0.5 mg by mouth as needed.       . montelukast (SINGULAIR) 10 MG tablet Take 10 mg by mouth daily.       . Multiple Vitamin (MULTI-VITAMIN PO) Take 1 tablet by mouth daily.        . pantoprazole (PROTONIX) 40 MG tablet Take 40 mg by mouth 2 (two) times daily.       . ranitidine (ZANTAC) 150 MG tablet Take 150 mg by mouth at bedtime.        No current facility-administered medications for this visit.    SURGICAL HISTORY:  Past Surgical History  Procedure Laterality Date  . Throat surgery      left vocal cord implants  . Eye surgery    . Mastectomy w/ nodes partial      had right partial mastectomy with sentinel lymph node biopsy  . Mastectomy      right mastectomy with TRAM  . Tram    . Vocal cord surgery      left  REVIEW OF SYSTEMS: 14 point review of system is as in the history above otherwise negative.  PHYSICAL EXAMINATION:  Filed Vitals:   08/15/12 0800  BP: 109/69  Pulse: 78  Resp: 14  GENERAL: No distress. SKIN:  No rashes or significant lesions  HEAD: Normocephalic, No masses, lesions, tenderness or abnormalities  EYES: Conjunctiva are pink and non-injected  ENT: External ears normal ,lips, buccal mucosa, and tongue normal and mucous membranes are moist  LYMPH: No palpable lymphadenopathy, in the neck supraclavicular or axillary regions. BREAST:reconstructed right breast noted no evidence of local recurrence.  Left breast unremarkable for masses. LUNGS: clear to auscultation , no crackles or wheezes HEART: regular rate & rhythm, no murmurs, no gallops, S1 normal and S2 normal  ABDOMEN: Abdomen soft, non-tender, normal bowel sounds, no masses or organomegaly and no hepatosplenomegaly palpable.  Healed surgical scar  noted. EXTREMITIES: No edema, no skin discoloration or tenderness      LABORATORY DATA: Lab Results  Component Value Date   WBC 4.7 08/13/2012   HGB 14.2 08/13/2012   HCT 41.0 08/13/2012   MCV 87.8 08/13/2012   PLT 187 08/13/2012      Chemistry      Component Value Date/Time   NA 142 08/13/2012 0824   K 3.7 08/13/2012 0824   CL 105 08/13/2012 0824   CO2 29 08/13/2012 0824   BUN 18 08/13/2012 0824   CREATININE 0.78 08/13/2012 0824      Component Value Date/Time   CALCIUM 9.6 08/13/2012 0824   ALKPHOS 46 08/13/2012 0824   AST 19 08/13/2012 0824   ALT 20 08/13/2012 0824   BILITOT 0.4 08/13/2012 0824       RADIOGRAPHIC STUDIES: No results found.   ASSESSMENT: Patient has no evidence of disease at this as regards her Breast cancer.  Also as regards her history of iron deficiency, there no evidence of anemia also.  PLAN:  1. Return to clinic in one year. 2. Continue yearly mammogram. 3. CBC CMP, iron studies and lipid profile yearly.     All questions were satisfactorily answered. Patient knows to call if  any concern arises.  I spent more than 50 % counseling the patient face to face. The total time spent in the appointment was 30 minutes.   Sherral Hammers, MD FACP. Hematology/Oncology.

## 2013-08-14 ENCOUNTER — Other Ambulatory Visit (HOSPITAL_COMMUNITY): Payer: BC Managed Care – PPO

## 2013-08-17 ENCOUNTER — Ambulatory Visit (HOSPITAL_COMMUNITY): Payer: BC Managed Care – PPO

## 2013-08-21 ENCOUNTER — Encounter (HOSPITAL_COMMUNITY): Payer: Self-pay

## 2014-04-28 ENCOUNTER — Encounter (INDEPENDENT_AMBULATORY_CARE_PROVIDER_SITE_OTHER): Payer: Self-pay

## 2014-04-28 ENCOUNTER — Encounter (INDEPENDENT_AMBULATORY_CARE_PROVIDER_SITE_OTHER): Payer: Self-pay | Admitting: *Deleted

## 2016-01-30 HISTORY — PX: LAPAROSCOPIC NISSEN FUNDOPLICATION: SHX1932

## 2016-09-19 DIAGNOSIS — G5601 Carpal tunnel syndrome, right upper limb: Secondary | ICD-10-CM | POA: Insufficient documentation

## 2017-01-02 DIAGNOSIS — K219 Gastro-esophageal reflux disease without esophagitis: Secondary | ICD-10-CM | POA: Insufficient documentation

## 2017-03-12 DIAGNOSIS — R05 Cough: Secondary | ICD-10-CM | POA: Diagnosis not present

## 2017-03-12 DIAGNOSIS — J329 Chronic sinusitis, unspecified: Secondary | ICD-10-CM | POA: Diagnosis not present

## 2017-03-14 DIAGNOSIS — Z1231 Encounter for screening mammogram for malignant neoplasm of breast: Secondary | ICD-10-CM | POA: Diagnosis not present

## 2017-03-18 DIAGNOSIS — D2262 Melanocytic nevi of left upper limb, including shoulder: Secondary | ICD-10-CM | POA: Diagnosis not present

## 2017-03-18 DIAGNOSIS — D225 Melanocytic nevi of trunk: Secondary | ICD-10-CM | POA: Diagnosis not present

## 2017-03-18 DIAGNOSIS — D2261 Melanocytic nevi of right upper limb, including shoulder: Secondary | ICD-10-CM | POA: Diagnosis not present

## 2017-03-18 DIAGNOSIS — L821 Other seborrheic keratosis: Secondary | ICD-10-CM | POA: Diagnosis not present

## 2017-04-29 DIAGNOSIS — Z0000 Encounter for general adult medical examination without abnormal findings: Secondary | ICD-10-CM | POA: Diagnosis not present

## 2017-05-17 DIAGNOSIS — K2270 Barrett's esophagus without dysplasia: Secondary | ICD-10-CM | POA: Diagnosis not present

## 2017-05-17 DIAGNOSIS — K219 Gastro-esophageal reflux disease without esophagitis: Secondary | ICD-10-CM | POA: Diagnosis not present

## 2017-05-17 DIAGNOSIS — Z48815 Encounter for surgical aftercare following surgery on the digestive system: Secondary | ICD-10-CM | POA: Diagnosis not present

## 2017-07-18 DIAGNOSIS — F419 Anxiety disorder, unspecified: Secondary | ICD-10-CM | POA: Diagnosis not present

## 2017-07-18 DIAGNOSIS — E782 Mixed hyperlipidemia: Secondary | ICD-10-CM | POA: Diagnosis not present

## 2017-07-18 DIAGNOSIS — M810 Age-related osteoporosis without current pathological fracture: Secondary | ICD-10-CM | POA: Diagnosis not present

## 2017-07-18 DIAGNOSIS — R5383 Other fatigue: Secondary | ICD-10-CM | POA: Diagnosis not present

## 2017-07-18 DIAGNOSIS — J309 Allergic rhinitis, unspecified: Secondary | ICD-10-CM | POA: Diagnosis not present

## 2017-11-20 DIAGNOSIS — Z23 Encounter for immunization: Secondary | ICD-10-CM | POA: Diagnosis not present

## 2017-12-17 DIAGNOSIS — Z9889 Other specified postprocedural states: Secondary | ICD-10-CM | POA: Diagnosis not present

## 2018-01-16 DIAGNOSIS — H6980 Other specified disorders of Eustachian tube, unspecified ear: Secondary | ICD-10-CM | POA: Diagnosis not present

## 2018-01-16 DIAGNOSIS — F419 Anxiety disorder, unspecified: Secondary | ICD-10-CM | POA: Diagnosis not present

## 2018-01-16 DIAGNOSIS — J309 Allergic rhinitis, unspecified: Secondary | ICD-10-CM | POA: Diagnosis not present

## 2018-01-16 DIAGNOSIS — R5383 Other fatigue: Secondary | ICD-10-CM | POA: Diagnosis not present

## 2018-01-16 DIAGNOSIS — E782 Mixed hyperlipidemia: Secondary | ICD-10-CM | POA: Diagnosis not present

## 2018-03-17 DIAGNOSIS — Z1231 Encounter for screening mammogram for malignant neoplasm of breast: Secondary | ICD-10-CM | POA: Diagnosis not present

## 2018-04-10 DIAGNOSIS — C50911 Malignant neoplasm of unspecified site of right female breast: Secondary | ICD-10-CM | POA: Diagnosis not present

## 2018-04-29 DIAGNOSIS — J302 Other seasonal allergic rhinitis: Secondary | ICD-10-CM | POA: Diagnosis not present

## 2018-04-29 DIAGNOSIS — G4700 Insomnia, unspecified: Secondary | ICD-10-CM | POA: Diagnosis not present

## 2018-04-29 DIAGNOSIS — C50911 Malignant neoplasm of unspecified site of right female breast: Secondary | ICD-10-CM | POA: Diagnosis not present

## 2018-04-29 DIAGNOSIS — M810 Age-related osteoporosis without current pathological fracture: Secondary | ICD-10-CM | POA: Diagnosis not present

## 2018-04-29 DIAGNOSIS — E785 Hyperlipidemia, unspecified: Secondary | ICD-10-CM | POA: Diagnosis not present

## 2018-05-05 ENCOUNTER — Encounter (INDEPENDENT_AMBULATORY_CARE_PROVIDER_SITE_OTHER): Payer: Self-pay | Admitting: *Deleted

## 2018-05-05 DIAGNOSIS — E785 Hyperlipidemia, unspecified: Secondary | ICD-10-CM | POA: Diagnosis not present

## 2018-05-05 DIAGNOSIS — M810 Age-related osteoporosis without current pathological fracture: Secondary | ICD-10-CM | POA: Diagnosis not present

## 2018-05-05 DIAGNOSIS — J302 Other seasonal allergic rhinitis: Secondary | ICD-10-CM | POA: Diagnosis not present

## 2018-05-05 DIAGNOSIS — G4700 Insomnia, unspecified: Secondary | ICD-10-CM | POA: Diagnosis not present

## 2018-05-07 DIAGNOSIS — M810 Age-related osteoporosis without current pathological fracture: Secondary | ICD-10-CM | POA: Diagnosis not present

## 2018-05-07 DIAGNOSIS — E785 Hyperlipidemia, unspecified: Secondary | ICD-10-CM | POA: Diagnosis not present

## 2018-05-07 DIAGNOSIS — J302 Other seasonal allergic rhinitis: Secondary | ICD-10-CM | POA: Diagnosis not present

## 2018-05-07 DIAGNOSIS — G4700 Insomnia, unspecified: Secondary | ICD-10-CM | POA: Diagnosis not present

## 2018-05-23 DIAGNOSIS — M79672 Pain in left foot: Secondary | ICD-10-CM | POA: Diagnosis not present

## 2018-08-07 ENCOUNTER — Other Ambulatory Visit (INDEPENDENT_AMBULATORY_CARE_PROVIDER_SITE_OTHER): Payer: Self-pay | Admitting: Internal Medicine

## 2018-08-07 DIAGNOSIS — Z860100 Personal history of colon polyps, unspecified: Secondary | ICD-10-CM

## 2018-08-08 DIAGNOSIS — F411 Generalized anxiety disorder: Secondary | ICD-10-CM | POA: Diagnosis not present

## 2018-08-08 DIAGNOSIS — J302 Other seasonal allergic rhinitis: Secondary | ICD-10-CM | POA: Diagnosis not present

## 2018-08-08 DIAGNOSIS — M25561 Pain in right knee: Secondary | ICD-10-CM | POA: Diagnosis not present

## 2018-08-08 DIAGNOSIS — B078 Other viral warts: Secondary | ICD-10-CM | POA: Diagnosis not present

## 2018-08-19 DIAGNOSIS — B078 Other viral warts: Secondary | ICD-10-CM | POA: Diagnosis not present

## 2018-09-09 DIAGNOSIS — B078 Other viral warts: Secondary | ICD-10-CM | POA: Diagnosis not present

## 2018-09-30 DIAGNOSIS — B078 Other viral warts: Secondary | ICD-10-CM | POA: Diagnosis not present

## 2018-10-01 ENCOUNTER — Encounter (INDEPENDENT_AMBULATORY_CARE_PROVIDER_SITE_OTHER): Payer: Self-pay | Admitting: *Deleted

## 2018-10-01 ENCOUNTER — Telehealth (INDEPENDENT_AMBULATORY_CARE_PROVIDER_SITE_OTHER): Payer: Self-pay | Admitting: *Deleted

## 2018-10-01 DIAGNOSIS — Z86010 Personal history of colonic polyps: Secondary | ICD-10-CM

## 2018-10-01 MED ORDER — PEG 3350-KCL-NA BICARB-NACL 420 G PO SOLR: 4000.0000 mL | Freq: Once | ORAL | 0 refills | Status: AC

## 2018-10-01 NOTE — Telephone Encounter (Signed)
Patient needs trilyte TCS sch'd 10/7/

## 2018-10-03 ENCOUNTER — Ambulatory Visit (INDEPENDENT_AMBULATORY_CARE_PROVIDER_SITE_OTHER): Payer: BC Managed Care – PPO | Admitting: Orthopedic Surgery

## 2018-10-03 ENCOUNTER — Encounter: Payer: Self-pay | Admitting: Orthopedic Surgery

## 2018-10-03 ENCOUNTER — Other Ambulatory Visit: Payer: Self-pay

## 2018-10-03 ENCOUNTER — Ambulatory Visit: Payer: BC Managed Care – PPO

## 2018-10-03 VITALS — Ht 64.0 in | Wt 126.0 lb

## 2018-10-03 DIAGNOSIS — G8929 Other chronic pain: Secondary | ICD-10-CM

## 2018-10-03 DIAGNOSIS — M25561 Pain in right knee: Secondary | ICD-10-CM

## 2018-10-03 MED ORDER — MELOXICAM 7.5 MG PO TABS: 7.5000 mg | ORAL_TABLET | Freq: Every day | ORAL | 5 refills | Status: DC

## 2018-10-03 NOTE — Patient Instructions (Signed)
Start medication

## 2018-10-03 NOTE — Progress Notes (Signed)
Kaitlyn Morrison  10/03/2018  HISTORY SECTION :  Chief Complaint  Patient presents with  . Knee Pain    right    Kaitlyn Morrison is 63 years old she comes in complaining of moderately severe right knee pain in the peripatellar region for about 1 month associated with pain getting out of a chair and climbing the steps and mild swelling   Review of Systems  Musculoskeletal: Positive for back pain.  All other systems reviewed and are negative.    Past Medical History:  Diagnosis Date  . Acid reflux   . DCIS (ductal carcinoma in situ) of breast   . Diverticulosis   . 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 History of shingles   . Hyperlipidemia   . Iron deficiency   . Iron deficiency anemia 07/19/2010  . Necrosis, breast fat   . Osteopenia   . Pars flaccida    lumbar back  . PE (pulmonary embolism)   . Pulled muscle may 2013   back  . Pulmonary embolism ([place]) 07/19/2010  . Tendonitis of elbow, right    History of    Past Surgical History:  Procedure Laterality Date  . EYE SURGERY    . MASTECTOMY     right mastectomy with TRAM  . MASTECTOMY W/ NODES PARTIAL     had right partial mastectomy with sentinel lymph node biopsy  . THROAT SURGERY     left vocal cord implants  . TRAM    . vocal cord surgery     left     Allergies  Allergen Reactions  . Ethinyl Estradiol     Developed Pulmonary Embolus while on this med  . Iodinated Diagnostic Agents Swelling and Nausea Only  . Iohexol      Desc: FACIAL SWELLING, STOMACH CRAMPS, PT NEEDS 13 HR PRE MEDICATION      Current Outpatient Medications:  .  alendronate (FOSAMAX) 70 MG tablet, 70 mg once a week. , Disp: , Rfl:  .  atorvastatin (LIPITOR) 10 MG tablet, Take 10 mg by mouth daily. , Disp: , Rfl:  .  Calcium Carbonate-Vit D-Min (CALTRATE 600+D PLUS MINERALS) 600-800 MG-UNIT CHEW, Chew by mouth., Disp: , Rfl:  .  IBUPROFEN PO, Take 800 mg by mouth as needed. , Disp: , Rfl:  .  LORazepam (ATIVAN) 0.5 MG  tablet, Take 0.5 mg by mouth as needed. , Disp: , Rfl:  .  montelukast (SINGULAIR) 10 MG tablet, Take 10 mg by mouth daily. , Disp: , Rfl:  .  Multiple Vitamin (MULTI-VITAMIN PO), Take 1 tablet by mouth daily.  , Disp: , Rfl:  .  Multiple Vitamins-Minerals (CENTRUM SILVER PO), Take by mouth., Disp: , Rfl:  .  amitriptyline (ELAVIL) 10 MG tablet, at bedtime. , Disp: , Rfl:  .  meloxicam (MOBIC) 7.5 MG tablet, Take 1 tablet (7.5 mg total) by mouth daily., Disp: 30 tablet, Rfl: 5   PHYSICAL EXAM SECTION: 1) Ht 5\' 4"  (1.626 m)   Wt 126 lb (57.2 kg)   BMI 21.63 kg/m   Body mass index is 21.63 kg/m. General appearance: Well-developed well-nourished no gross deformities  2) Cardiovascular normal pulse and perfusion in the lower  extremities normal color without edema  3) Neurologically deep tendon reflexes are equal and normal, no sensation loss or deficits no pathologic reflexes  4) Psychological: Awake alert and oriented x3 mood and affect normal  5) Skin no lacerations or ulcerations no nodularity no palpable masses, no erythema  or nodularity  6) Musculoskeletal:   Right knee peripatellar tenderness crepitance positive compression test normal range of motion negative McMurray sign all ligaments were stable muscle strength and tone were normal  MEDICAL DECISION SECTION:  Encounter Diagnosis  Name Primary?  . Chronic pain of right knee Yes    Imaging Imaging show normal alignment of the knee with patellofemoral arthritis normal tibiofemoral joint Plan:  (Rx., Inj., surg., Frx, MRI/CT, XR:2)  Recommend start arthritis medication she did not want injection today  Meds ordered this encounter  Medications  . meloxicam (MOBIC) 7.5 MG tablet    Sig: Take 1 tablet (7.5 mg total) by mouth daily.    Dispense:  30 tablet    Refill:  5     9:03 AM Arther Abbott, MD  10/03/2018

## 2018-10-09 ENCOUNTER — Ambulatory Visit (INDEPENDENT_AMBULATORY_CARE_PROVIDER_SITE_OTHER): Payer: Self-pay

## 2018-10-09 ENCOUNTER — Other Ambulatory Visit: Payer: Self-pay

## 2018-10-09 ENCOUNTER — Telehealth (INDEPENDENT_AMBULATORY_CARE_PROVIDER_SITE_OTHER): Payer: Self-pay | Admitting: *Deleted

## 2018-10-09 NOTE — Telephone Encounter (Signed)
Referring MD/PCP: hall   Procedure: tcs  Reason/Indication:  Hx polyps  Has patient had this procedure before?  Yes, 2011  If so, when, by whom and where?    Is there a family history of colon cancer?  no  Who?  What age when diagnosed?    Is patient diabetic?   no      Does patient have prosthetic heart valve or mechanical valve?  no  Do you have a pacemaker/defibrillator?  no  Has patient ever had endocarditis/atrial fibrillation? no  Does patient use oxygen? no  Has patient had joint replacement within last 12 months?  no  Is patient constipated or do they take laxatives? no  Does patient have a history of alcohol/drug use?  no  Is patient on blood thinner such as Coumadin, Plavix and/or Aspirin? no  Medications: lipitor 10 mg daily, centrum silver daily, caltrate daily, singulair 10 mg daily, amitriptyline 10 mg daily, nasal spray bid, fosamax 70 mg once a week  Allergies: IVP dye  Medication Adjustment per Dr Charlena Cross, NP:   Procedure date & time: 11/05/18 at 1030

## 2018-10-14 DIAGNOSIS — Z23 Encounter for immunization: Secondary | ICD-10-CM | POA: Diagnosis not present

## 2018-10-21 DIAGNOSIS — B078 Other viral warts: Secondary | ICD-10-CM | POA: Diagnosis not present

## 2018-10-21 NOTE — Telephone Encounter (Signed)
Colonoscopy with conscious sedation 

## 2018-11-03 ENCOUNTER — Other Ambulatory Visit: Payer: Self-pay

## 2018-11-03 ENCOUNTER — Other Ambulatory Visit (HOSPITAL_COMMUNITY)
Admission: RE | Admit: 2018-11-03 | Discharge: 2018-11-03 | Disposition: A | Discharge status: Home / Self Care | Payer: BC Managed Care – PPO | Source: Ambulatory Visit | Attending: Internal Medicine | Admitting: Internal Medicine

## 2018-11-03 DIAGNOSIS — Z01812 Encounter for preprocedural laboratory examination: Secondary | ICD-10-CM | POA: Insufficient documentation

## 2018-11-03 DIAGNOSIS — K649 Unspecified hemorrhoids: Secondary | ICD-10-CM | POA: Insufficient documentation

## 2018-11-03 DIAGNOSIS — K5790 Diverticulosis of intestine, part unspecified, without perforation or abscess without bleeding: Secondary | ICD-10-CM | POA: Diagnosis not present

## 2018-11-03 DIAGNOSIS — K6289 Other specified diseases of anus and rectum: Secondary | ICD-10-CM | POA: Insufficient documentation

## 2018-11-03 DIAGNOSIS — K621 Rectal polyp: Secondary | ICD-10-CM | POA: Insufficient documentation

## 2018-11-03 DIAGNOSIS — Z20828 Contact with and (suspected) exposure to other viral communicable diseases: Secondary | ICD-10-CM | POA: Diagnosis not present

## 2018-11-03 LAB — SARS CORONAVIRUS 2 (TAT 6-24 HRS): SARS Coronavirus 2: NEGATIVE

## 2018-11-05 ENCOUNTER — Encounter (HOSPITAL_COMMUNITY): Payer: Self-pay | Admitting: *Deleted

## 2018-11-05 ENCOUNTER — Ambulatory Visit (HOSPITAL_COMMUNITY)
Admission: RE | Admit: 2018-11-05 | Discharge: 2018-11-05 | Disposition: A | Discharge status: Home / Self Care | Payer: BC Managed Care – PPO | Attending: Internal Medicine | Admitting: Internal Medicine

## 2018-11-05 ENCOUNTER — Other Ambulatory Visit: Payer: Self-pay

## 2018-11-05 ENCOUNTER — Encounter (HOSPITAL_COMMUNITY)
Admission: RE | Disposition: A | Discharge status: Home / Self Care | Payer: Self-pay | Source: Home / Self Care | Attending: Internal Medicine

## 2018-11-05 DIAGNOSIS — K644 Residual hemorrhoidal skin tags: Secondary | ICD-10-CM | POA: Diagnosis not present

## 2018-11-05 DIAGNOSIS — K621 Rectal polyp: Secondary | ICD-10-CM | POA: Diagnosis not present

## 2018-11-05 DIAGNOSIS — M8580 Other specified disorders of bone density and structure, unspecified site: Secondary | ICD-10-CM | POA: Insufficient documentation

## 2018-11-05 DIAGNOSIS — E7800 Pure hypercholesterolemia, unspecified: Secondary | ICD-10-CM | POA: Insufficient documentation

## 2018-11-05 DIAGNOSIS — Z79899 Other long term (current) drug therapy: Secondary | ICD-10-CM | POA: Insufficient documentation

## 2018-11-05 DIAGNOSIS — D123 Benign neoplasm of transverse colon: Secondary | ICD-10-CM | POA: Diagnosis not present

## 2018-11-05 DIAGNOSIS — Z86010 Personal history of colonic polyps: Secondary | ICD-10-CM

## 2018-11-05 DIAGNOSIS — Z86711 Personal history of pulmonary embolism: Secondary | ICD-10-CM | POA: Insufficient documentation

## 2018-11-05 DIAGNOSIS — Z1211 Encounter for screening for malignant neoplasm of colon: Secondary | ICD-10-CM | POA: Insufficient documentation

## 2018-11-05 DIAGNOSIS — Z09 Encounter for follow-up examination after completed treatment for conditions other than malignant neoplasm: Secondary | ICD-10-CM | POA: Diagnosis not present

## 2018-11-05 DIAGNOSIS — K6289 Other specified diseases of anus and rectum: Secondary | ICD-10-CM

## 2018-11-05 DIAGNOSIS — D128 Benign neoplasm of rectum: Secondary | ICD-10-CM | POA: Diagnosis not present

## 2018-11-05 DIAGNOSIS — K5730 Diverticulosis of large intestine without perforation or abscess without bleeding: Secondary | ICD-10-CM | POA: Insufficient documentation

## 2018-11-05 HISTORY — DX: Pure hypercholesterolemia, unspecified: E78.00

## 2018-11-05 HISTORY — PX: POLYPECTOMY: SHX5525

## 2018-11-05 HISTORY — PX: COLONOSCOPY: SHX5424

## 2018-11-05 SURGERY — COLONOSCOPY
Anesthesia: Moderate Sedation

## 2018-11-05 MED ORDER — STERILE WATER FOR IRRIGATION IR SOLN
Status: DC | PRN
  Administered 2018-11-05: 2.5 mL

## 2018-11-05 MED ORDER — MEPERIDINE HCL 50 MG/ML IJ SOLN
INTRAMUSCULAR | Status: AC
  Filled 2018-11-05: qty 1

## 2018-11-05 MED ORDER — MIDAZOLAM HCL 5 MG/5ML IJ SOLN
INTRAMUSCULAR | Status: DC | PRN
  Administered 2018-11-05: 1 mg via INTRAVENOUS
  Administered 2018-11-05: 2 mg via INTRAVENOUS
  Administered 2018-11-05 (×2): 1 mg via INTRAVENOUS
  Administered 2018-11-05: 2 mg via INTRAVENOUS

## 2018-11-05 MED ORDER — MEPERIDINE HCL 50 MG/ML IJ SOLN
INTRAMUSCULAR | Status: DC | PRN
  Administered 2018-11-05 (×2): 25 mg via INTRAVENOUS

## 2018-11-05 MED ORDER — MIDAZOLAM HCL 5 MG/5ML IJ SOLN
INTRAMUSCULAR | Status: AC
  Filled 2018-11-05: qty 10

## 2018-11-05 MED ORDER — SODIUM CHLORIDE 0.9 % IV SOLN
INTRAVENOUS | Status: DC
  Administered 2018-11-05: 10:00:00 via INTRAVENOUS

## 2018-11-05 NOTE — H&P (Signed)
Kaitlyn Morrison is an 63 y.o. female.   Chief Complaint: Patient is here for colonoscopy. HPI: Patient is 63 year old Caucasian female who has history of colonic polyps and is here for surveillance colonoscopy.  She has had tubular adenomas on her prior colonoscopies.  Last colonoscopy was in [facility].  She denies abdominal pain change in bowel habits or rectal bleeding. Personal history significant for DCIS of breast.  She had surgery in 2000 and remains in remission. Family history significant for colonic polyps in her father and a brother.  Past Medical History:  Diagnosis Date  . DCIS (ductal carcinoma in situ) of breast 2000  . Diverticulosis   . History of shingles   . Hypercholesteremia   . Hyperlipidemia   . Iron deficiency   . Iron deficiency anemia 07/19/2010  . Necrosis, breast fat   . Osteopenia   . Pars flaccida    lumbar back  . PE (pulmonary embolism)   . Pulled muscle may 2013   back  . Pulmonary embolism ([place]) 07/19/2010  . Tendonitis of elbow, right    History of    Past Surgical History:  Procedure Laterality Date  . EYE SURGERY    . LAPAROSCOPIC NISSEN FUNDOPLICATION  99991111  . MASTECTOMY     right mastectomy with TRAM  . MASTECTOMY W/ NODES PARTIAL     had right partial mastectomy with sentinel lymph node biopsy  . THROAT SURGERY     left vocal cord implants  . TRAM    . vocal cord surgery     left    Family History  Problem Relation Age of Onset  . Cancer Other        family history   . Diabetes Other        family history   . Heart defect Other        family history   . Colon cancer Neg Hx    Social History:  reports that she has never smoked. She has never used smokeless tobacco. She reports that she does not drink alcohol or use drugs.  Allergies:  Allergies  Allergen Reactions  . Ethinyl Estradiol     Developed Pulmonary Embolus while on this med  . Iodinated Diagnostic Agents Swelling and Nausea Only  . Iohexol    Desc: FACIAL SWELLING, STOMACH CRAMPS, PT NEEDS 13 HR PRE MEDICATION     Medications Prior to Admission  Medication Sig Dispense Refill  . acetaminophen (TYLENOL) 650 MG CR tablet Take 1,300 mg by mouth every 8 (eight) hours as needed for pain.    Marland Kitchen alendronate (FOSAMAX) 70 MG tablet Take 70 mg by mouth every Wednesday.     Marland Kitchen atorvastatin (LIPITOR) 10 MG tablet Take 10 mg by mouth at bedtime.     . Calcium Carbonate-Vit D-Min (CALTRATE 600+D PLUS MINERALS) 600-800 MG-UNIT CHEW Chew 1 tablet by mouth continuous dialysis.     . fluticasone (FLONASE) 50 MCG/ACT nasal spray Place 1 spray into both nostrils 2 (two) times daily.    Marland Kitchen ibuprofen (ADVIL) 200 MG tablet Take 400 mg by mouth every 6 (six) hours as needed (FOR PAIN.).    Marland Kitchen imiquimod (ALDARA) 5 % cream Apply 1 application topically 3 (three) times a week. AT NIGHT.    Marland Kitchen LORazepam (ATIVAN) 0.5 MG tablet Take 0.5 mg by mouth at bedtime.     . meloxicam (MOBIC) 7.5 MG tablet Take 1 tablet (7.5 mg total) by mouth daily. (Patient taking differently: Take 7.5  mg by mouth every evening. ) 30 tablet 5  . montelukast (SINGULAIR) 10 MG tablet Take 10 mg by mouth at bedtime.     . Multiple Vitamin (MULTIVITAMIN WITH MINERALS) TABS tablet Take 1 tablet by mouth daily.    Marland Kitchen amitriptyline (ELAVIL) 10 MG tablet at bedtime.       No results found for this or any previous visit (from the past 48 hour(s)). No results found.  ROS  Blood pressure 107/72, pulse 72, temperature 98.5 F (36.9 C), temperature source Oral, resp. rate 12, height 5\' 5"  (1.651 m), weight 56.2 kg, SpO2 100 %. Physical Exam  Constitutional:  Well-developed thin Caucasian female in NAD.  HENT:  Mouth/Throat: Oropharynx is clear and moist.  Eyes: Conjunctivae are normal. No scleral icterus.  Neck: No thyromegaly present.  Cardiovascular: Normal rate, regular rhythm and normal heart sounds.  No murmur heard. Respiratory: Effort normal and breath sounds normal.  GI:  Abdomen  is flat.  She has scar across lower abdomen.  Abdomen is soft and nontender with organomegaly or masses.  Musculoskeletal:        General: No edema.  Lymphadenopathy:    She has no cervical adenopathy.  Neurological: She is alert.  Skin: Skin is warm and dry.     Assessment/Plan History of colonic polyps. Surveillance colonoscopy.  Hildred Laser, MD 11/05/2018, 10:33 AM

## 2018-11-05 NOTE — Op Note (Signed)
[facility] Patient Name: Kaitlyn Morrison Procedure Date: 11/05/2018 10:12 AM MRN: [id number] Date of Birth: 1955/04/20 Attending MD: Hildred Laser , MD CSN: [id number] Age: 63 Admit Type: Outpatient Procedure:                Colonoscopy Indications:              High risk colon cancer surveillance: Personal                            history of colonic polyps Providers:                Hildred Laser, MD, Otis Peak B. Sharon Seller, RN, Raphael Gibney, Technician Referring MD:             Delphina Cahill, MD Medicines:                Meperidine 50 mg IV, Midazolam 7 mg IV Complications:            No immediate complications. Estimated Blood Loss:     Estimated blood loss was minimal. Procedure:                Pre-Anesthesia Assessment:                           - Prior to the procedure, a History and Physical                            was performed, and patient medications and                            allergies were reviewed. The patient's tolerance of                            previous anesthesia was also reviewed. The risks                            and benefits of the procedure and the sedation                            options and risks were discussed with the patient.                            All questions were answered, and informed consent                            was obtained. Prior Anticoagulants: The patient has                            taken no previous anticoagulant or antiplatelet                            agents except for NSAID medication. ASA Grade  Assessment: II - A patient with mild systemic                            disease. After reviewing the risks and benefits,                            the patient was deemed in satisfactory condition to                            undergo the procedure.                           After obtaining informed consent, the colonoscope                            was passed under  direct vision. Throughout the                            procedure, the patient's blood pressure, pulse, and                            oxygen saturations were monitored continuously. The                            [id number]) was introduced through the                            anus and advanced to the the cecum, identified by                            appendiceal orifice and ileocecal valve. The                            colonoscopy was somewhat difficult due to a                            tortuous colon. Successful completion of the                            procedure was aided by increasing the dose of                            sedation medication, using manual pressure and                            withdrawing and reinserting the scope. The patient                            tolerated the procedure well. The quality of the                            bowel preparation was excellent. The ileocecal  valve, appendiceal orifice, and rectum were                            photographed. Scope In: 10:43:28 AM Scope Out: 11:08:38 AM Scope Withdrawal Time: 0 hours 12 minutes 50 seconds  Total Procedure Duration: 0 hours 25 minutes 10 seconds  Findings:      The perianal and digital rectal examinations were normal.      Two polyps were found in the rectum and hepatic flexure. The polyps were       diminutive in size. These were biopsied with a cold forceps for       histology. The pathology specimen was placed into Bottle Number 1.      Scattered diverticula were found in the sigmoid colon.      External hemorrhoids were found during retroflexion. The hemorrhoids       were small.      Anal papilla(e) were hypertrophied. Impression:               - Two diminutive polyps in the rectum and at the                            hepatic flexure. Biopsied.                           - Diverticulosis in the sigmoid colon.                           - External  hemorrhoids.                           - Anal papilla(e) were hypertrophied. Moderate Sedation:      Moderate (conscious) sedation was administered by the endoscopy nurse       and supervised by the endoscopist. The following parameters were       monitored: oxygen saturation, heart rate, blood pressure, CO2       capnography and response to care. Total physician intraservice time was       30 minutes. Recommendation:           - Patient has a contact number available for                            emergencies. The signs and symptoms of potential                            delayed complications were discussed with the                            patient. Return to normal activities tomorrow.                            Written discharge instructions were provided to the                            patient.                           - High fiber diet today.                           -  Continue present medications.                           - No aspirin, ibuprofen, naproxen, or other                            non-steroidal anti-inflammatory drugs for 1 day.                           - Await pathology results.                           - Repeat colonoscopy is recommended. The                            colonoscopy date will be determined after pathology                            results from today's exam become available for                            review. Procedure Code(s):        --- Professional ---                           [phone], Colonoscopy, flexible; with biopsy, single                            or multiple                           99153, Moderate sedation; each additional 15                            minutes intraservice time                           G0500, Moderate sedation services provided by the                            same physician or other qualified health care                            professional performing a gastrointestinal                             endoscopic service that sedation supports,                            requiring the presence of an independent trained                            observer to assist in the monitoring of the                            patient's level of consciousness and physiological  status; initial 15 minutes of intra-service time;                            patient age 84 years or older (additional time may                            be reported with [phone], as appropriate) Diagnosis Code(s):        --- Professional ---                           Z86.010, Personal history of colonic polyps                           K62.1, Rectal polyp                           K63.5, Polyp of colon                           K64.4, Residual hemorrhoidal skin tags                           K62.89, Other specified diseases of anus and rectum                           K57.30, Diverticulosis of large intestine without                            perforation or abscess without bleeding CPT copyright 2019 American Medical Association. All rights reserved. The codes documented in this report are preliminary and upon coder review may  be revised to meet current compliance requirements. Hildred Laser, MD Hildred Laser, MD 11/05/2018 11:17:31 AM This report has been signed electronically. Number of Addenda: 0

## 2018-11-05 NOTE — Discharge Instructions (Signed)
No aspirin or NSAIDs for 24 hours. Resume usual medications. High-fiber diet. No driving for 24 hours. Physician will call with biopsy results and further recommendations.      Colonoscopy, Adult, Care After This sheet gives you information about how to care for yourself after your procedure. Your doctor may also give you more specific instructions. If you have problems or questions, call your doctor. What can I expect after the procedure? After the procedure, it is common to have:  A small amount of blood in your poop for 24 hours.  Some gas.  Mild cramping or bloating in your belly. Follow these instructions at home: General instructions  For the first 24 hours after the procedure: ? Do not drive or use machinery. ? Do not sign important documents. ? Do not drink alcohol. ? Do your daily activities more slowly than normal. ? Eat foods that are soft and easy to digest.  Take over-the-counter or prescription medicines only as told by your doctor. To help cramping and bloating:   Try walking around.  Put heat on your belly (abdomen) as told by your doctor. Use a heat source that your doctor recommends, such as a moist heat pack or a heating pad. ? Put a towel between your skin and the heat source. ? Leave the heat on for 20-30 minutes. ? Remove the heat if your skin turns bright red. This is especially important if you cannot feel pain, heat, or cold. You can get burned. Eating and drinking   Drink enough fluid to keep your pee (urine) clear or pale yellow.  Return to your normal diet as told by your doctor. Avoid heavy or fried foods that are hard to digest.  Avoid drinking alcohol for as long as told by your doctor. Contact a doctor if:  You have blood in your poop (stool) 2-3 days after the procedure. Get help right away if:  You have more than a small amount of blood in your poop.  You see large clumps of tissue (blood clots) in your poop.  Your belly is  swollen.  You feel sick to your stomach (nauseous).  You throw up (vomit).  You have a fever.  You have belly pain that gets worse, and medicine does not help your pain. Summary  After the procedure, it is common to have a small amount of blood in your poop. You may also have mild cramping and bloating in your belly.  For the first 24 hours after the procedure, do not drive or use machinery, do not sign important documents, and do not drink alcohol.  Get help right away if you have a lot of blood in your poop, feel sick to your stomach, have a fever, or have more belly pain. This information is not intended to replace advice given to you by your health care provider. Make sure you discuss any questions you have with your health care provider. Document Released: 02/17/2010 Document Revised: 11/15/2016 Document Reviewed: 10/10/2015 Elsevier Patient Education  [place].     High-Fiber Diet Fiber, also called dietary fiber, is a type of carbohydrate that is found in fruits, vegetables, whole grains, and beans. A high-fiber diet can have many health benefits. Your health care provider may recommend a high-fiber diet to help:  Prevent constipation. Fiber can make your bowel movements more regular.  Lower your cholesterol.  Relieve the following conditions: ? Swelling of veins in the anus (hemorrhoids). ? Swelling and irritation (inflammation) of specific areas of  the digestive tract (uncomplicated diverticulosis). ? A problem of the large intestine (colon) that sometimes causes pain and diarrhea (irritable bowel syndrome, IBS).  Prevent overeating as part of a weight-loss plan.  Prevent heart disease, type 2 diabetes, and certain cancers. What is my plan? The recommended daily fiber intake in grams (g) includes:  38 g for men age 19 or younger.  30 g for men over age 39.  69 g for women age 40 or younger.  21 g for women over age 31. You can get the recommended  daily intake of dietary fiber by:  Eating a variety of fruits, vegetables, grains, and beans.  Taking a fiber supplement, if it is not possible to get enough fiber through your diet. What do I need to know about a high-fiber diet?  It is better to get fiber through food sources rather than from fiber supplements. There is not a lot of research about how effective supplements are.  Always check the fiber content on the nutrition facts label of any prepackaged food. Look for foods that contain 5 g of fiber or more per serving.  Talk with a diet and nutrition specialist (dietitian) if you have questions about specific foods that are recommended or not recommended for your medical condition, especially if those foods are not listed below.  Gradually increase how much fiber you consume. If you increase your intake of dietary fiber too quickly, you may have bloating, cramping, or gas.  Drink plenty of water. Water helps you to digest fiber. What are tips for following this plan?  Eat a wide variety of high-fiber foods.  Make sure that half of the grains that you eat each day are whole grains.  Eat breads and cereals that are made with whole-grain flour instead of refined flour or white flour.  Eat brown rice, bulgur wheat, or millet instead of white rice.  Start the day with a breakfast that is high in fiber, such as a cereal that contains 5 g of fiber or more per serving.  Use beans in place of meat in soups, salads, and pasta dishes.  Eat high-fiber snacks, such as berries, raw vegetables, nuts, and popcorn.  Choose whole fruits and vegetables instead of processed forms like juice or sauce. What foods can I eat?  Fruits Berries. Pears. Apples. Oranges. Avocado. Prunes and raisins. Dried figs. Vegetables Sweet potatoes. Spinach. Kale. Artichokes. Cabbage. Broccoli. Cauliflower. Green peas. Carrots. Squash. Grains Whole-grain breads. Multigrain cereal. Oats and oatmeal. Brown rice.  Barley. Bulgur wheat. Gu-Win. Quinoa. Bran muffins. Popcorn. Rye wafer crackers. Meats and other proteins Navy, kidney, and pinto beans. Soybeans. Split peas. Lentils. Nuts and seeds. Dairy Fiber-fortified yogurt. Beverages Fiber-fortified soy milk. Fiber-fortified orange juice. Other foods Fiber bars. The items listed above may not be a complete list of recommended foods and beverages. Contact a dietitian for more options. What foods are not recommended? Fruits Fruit juice. Cooked, strained fruit. Vegetables Fried potatoes. Canned vegetables. Well-cooked vegetables. Grains White bread. Pasta made with refined flour. White rice. Meats and other proteins Fatty cuts of meat. Fried chicken or fried fish. Dairy Milk. Yogurt. Cream cheese. Sour cream. Fats and oils Butters. Beverages Soft drinks. Other foods Cakes and pastries. The items listed above may not be a complete list of foods and beverages to avoid. Contact a dietitian for more information. Summary  Fiber is a type of carbohydrate. It is found in fruits, vegetables, whole grains, and beans.  There are many health benefits of eating  a high-fiber diet, such as preventing constipation, lowering blood cholesterol, helping with weight loss, and reducing your risk of heart disease, diabetes, and certain cancers.  Gradually increase your intake of fiber. Increasing too fast can result in cramping, bloating, and gas. Drink plenty of water while you increase your fiber.  The best sources of fiber include whole fruits and vegetables, whole grains, nuts, seeds, and beans. This information is not intended to replace advice given to you by your health care provider. Make sure you discuss any questions you have with your health care provider. Document Released: 01/15/2005 Document Revised: 11/19/2016 Document Reviewed: 11/19/2016 Elsevier Patient Education  [place].    Colon Polyps  Polyps are tissue growths inside the  body. Polyps can grow in many places, including the large intestine (colon). A polyp may be a round bump or a mushroom-shaped growth. You could have one polyp or several. Most colon polyps are noncancerous (benign). However, some colon polyps can become cancerous over time. Finding and removing the polyps early can help prevent this. What are the causes? The exact cause of colon polyps is not known. What increases the risk? You are more likely to develop this condition if you:  Have a family history of colon cancer or colon polyps.  Are older than 68 or older than 45 if you are African American.  Have inflammatory bowel disease, such as ulcerative colitis or Crohn's disease.  Have certain hereditary conditions, such as: ? Familial adenomatous polyposis. ? Lynch syndrome. ? Turcot syndrome. ? Peutz-Jeghers syndrome.  Are overweight.  Smoke cigarettes.  Do not get enough exercise.  Drink too much alcohol.  Eat a diet that is high in fat and red meat and low in fiber.  Had childhood cancer that was treated with abdominal radiation. What are the signs or symptoms? Most polyps do not cause symptoms. If you have symptoms, they may include:  Blood coming from your rectum when having a bowel movement.  Blood in your stool. The stool may look dark red or black.  Abdominal pain.  A change in bowel habits, such as constipation or diarrhea. How is this diagnosed? This condition is diagnosed with a colonoscopy. This is a procedure in which a lighted, flexible scope is inserted into the anus and then passed into the colon to examine the area. Polyps are sometimes found when a colonoscopy is done as part of routine cancer screening tests. How is this treated? Treatment for this condition involves removing any polyps that are found. Most polyps can be removed during a colonoscopy. Those polyps will then be tested for cancer. Additional treatment may be needed depending on the results of  testing. Follow these instructions at home: Lifestyle  Maintain a healthy weight, or lose weight if recommended by your health care provider.  Exercise every day or as told by your health care provider.  Do not use any products that contain nicotine or tobacco, such as cigarettes and e-cigarettes. If you need help quitting, ask your health care provider.  If you drink alcohol, limit how much you have: ? 0-1 drink a day for women. ? 0-2 drinks a day for men.  Be aware of how much alcohol is in your drink. In the [place] one drink equals one 12 oz bottle of beer (355 mL), one 5 oz glass of wine (148 mL), or one 1 oz shot of hard liquor (44 mL). Eating and drinking   Eat foods that are high in fiber, such as  fruits, vegetables, and whole grains.  Eat foods that are high in calcium and vitamin D, such as milk, cheese, yogurt, eggs, liver, fish, and broccoli.  Limit foods that are high in fat, such as fried foods and desserts.  Limit the amount of red meat and processed meat you eat, such as hot dogs, sausage, bacon, and lunch meats. General instructions  Keep all follow-up visits as told by your health care provider. This is important. ? This includes having regularly scheduled colonoscopies. ? Talk to your health care provider about when you need a colonoscopy. Contact a health care provider if:  You have new or worsening bleeding during a bowel movement.  You have new or increased blood in your stool.  You have a change in bowel habits.  You lose weight for no known reason. Summary  Polyps are tissue growths inside the body. Polyps can grow in many places, including the colon.  Most colon polyps are noncancerous (benign), but some can become cancerous over time.  This condition is diagnosed with a colonoscopy.  Treatment for this condition involves removing any polyps that are found. Most polyps can be removed during a colonoscopy. This information is not intended to  replace advice given to you by your health care provider. Make sure you discuss any questions you have with your health care provider. Document Released: 10/12/2003 Document Revised: 05/02/2017 Document Reviewed: 05/02/2017 Elsevier Patient Education  2020 [employer].

## 2018-11-06 LAB — SURGICAL PATHOLOGY

## 2018-11-10 ENCOUNTER — Telehealth (INDEPENDENT_AMBULATORY_CARE_PROVIDER_SITE_OTHER): Payer: Self-pay | Admitting: Internal Medicine

## 2018-11-10 NOTE — Telephone Encounter (Signed)
Patient called regarding procedure results - ph# [phone]

## 2018-11-11 DIAGNOSIS — M810 Age-related osteoporosis without current pathological fracture: Secondary | ICD-10-CM | POA: Diagnosis not present

## 2018-11-11 DIAGNOSIS — E785 Hyperlipidemia, unspecified: Secondary | ICD-10-CM | POA: Diagnosis not present

## 2018-11-11 DIAGNOSIS — Z1329 Encounter for screening for other suspected endocrine disorder: Secondary | ICD-10-CM | POA: Diagnosis not present

## 2018-11-11 DIAGNOSIS — M1A00X0 Idiopathic chronic gout, unspecified site, without tophus (tophi): Secondary | ICD-10-CM | POA: Diagnosis not present

## 2018-11-11 DIAGNOSIS — B078 Other viral warts: Secondary | ICD-10-CM | POA: Diagnosis not present

## 2018-11-12 ENCOUNTER — Encounter (HOSPITAL_COMMUNITY): Payer: Self-pay | Admitting: Internal Medicine

## 2018-11-17 NOTE — Telephone Encounter (Signed)
This has already been addressed.  I talk with patient on 11/11/2018.

## 2018-12-18 DIAGNOSIS — K219 Gastro-esophageal reflux disease without esophagitis: Secondary | ICD-10-CM | POA: Diagnosis not present

## 2019-02-03 DIAGNOSIS — F419 Anxiety disorder, unspecified: Secondary | ICD-10-CM | POA: Diagnosis not present

## 2019-02-03 DIAGNOSIS — G4700 Insomnia, unspecified: Secondary | ICD-10-CM | POA: Diagnosis not present

## 2019-03-18 ENCOUNTER — Other Ambulatory Visit: Payer: Self-pay | Admitting: Orthopedic Surgery

## 2019-03-18 DIAGNOSIS — G8929 Other chronic pain: Secondary | ICD-10-CM

## 2019-04-02 DIAGNOSIS — Z23 Encounter for immunization: Secondary | ICD-10-CM | POA: Diagnosis not present

## 2019-04-14 ENCOUNTER — Other Ambulatory Visit: Payer: Self-pay

## 2019-04-14 ENCOUNTER — Ambulatory Visit
Admission: EM | Admit: 2019-04-14 | Discharge: 2019-04-14 | Disposition: A | Discharge status: Home / Self Care | Payer: BC Managed Care – PPO | Attending: Internal Medicine | Admitting: Internal Medicine

## 2019-04-14 DIAGNOSIS — J029 Acute pharyngitis, unspecified: Secondary | ICD-10-CM

## 2019-04-14 LAB — POCT RAPID STREP A (OFFICE): Rapid Strep A Screen: NEGATIVE

## 2019-04-14 NOTE — ED Provider Notes (Signed)
[facility]    CSN: [id number] Arrival date & time: 04/14/19  1632      History   Chief Complaint Chief Complaint  Patient presents with  . Sore Throat    HPI Kaitlyn Morrison is a 64 y.o. female history hyperlipidemia, GERD, prior PE, presenting today for evaluation of sore throat.  Patient woke up this morning with a mild sore throat which is slightly worsened Today.  She noticed a white spot on her left tonsil.  Left side slightly worse than right.  Present for several are inflamed.  She denies associated congestion or cough.  Denies associated fevers chills or body aches.  Denies any close sick contacts.  Took some Tylenol earlier to help with discomfort.  Has started to feel slightly fatigued this afternoon.  Mild left ear pressure with sore throat.  HPI  Past Medical History:  Diagnosis Date  . DCIS (ductal carcinoma in situ) of breast 2000  . Diverticulosis   . History of shingles   . Hypercholesteremia   . Hyperlipidemia   . Iron deficiency   . Iron deficiency anemia 07/19/2010  . Necrosis, breast fat   . Osteopenia   . Pars flaccida    lumbar back  . PE (pulmonary embolism)   . Pulled muscle may 2013   back  . Pulmonary embolism ([place]) 07/19/2010  . Tendonitis of elbow, right    History of    Patient Active Problem List   Diagnosis Date Noted  . Hx of colonic polyps 08/07/2018  . Gastroesophageal reflux disease without esophagitis 01/02/2017  . 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 Iron deficiency anemia 07/19/2010  . Pulmonary embolism ([place]) 07/19/2010  . Tendonitis of elbow, right   . DERANGEMENT OF POSTERIOR HORN OF MEDIAL MENISCUS 02/27/2010  . 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 ILIOTIBIAL BAND SYNDROME, LEFT KNEE 02/27/2010    Past Surgical History:  Procedure Laterality Date  . COLONOSCOPY N/A 11/05/2018   Procedure: COLONOSCOPY;  Surgeon: Rogene Houston, MD;   [facility];  Service: Endoscopy;  Laterality: N/A;  1030  . EYE SURGERY    . LAPAROSCOPIC NISSEN FUNDOPLICATION  [id number]  . MASTECTOMY     right mastectomy with TRAM  . MASTECTOMY W/ NODES PARTIAL     had right partial mastectomy with sentinel lymph node biopsy  . POLYPECTOMY  11/05/2018   Procedure: POLYPECTOMY;  Surgeon: Rogene Houston, MD;  Location: [facility];  Service: Endoscopy;;  colon  . THROAT SURGERY     left vocal cord implants  . TRAM    . vocal cord surgery     left    OB History   No obstetric history on file.      Home Medications    Prior to Admission medications   Medication Sig Start Date End Date Taking? Authorizing Provider  acetaminophen (TYLENOL) 650 MG CR tablet Take 1,300 mg by mouth every 8 (eight) hours as needed for pain.    [provider]  alendronate (FOSAMAX) 70 MG tablet Take 70 mg by mouth every Wednesday.  02/06/10   [provider]  amitriptyline (ELAVIL) 10 MG tablet at bedtime.  04/14/10   [provider]  atorvastatin (LIPITOR) 10 MG tablet Take 10 mg by mouth at bedtime.     [provider]  Calcium Carbonate-Vit D-Min (CALTRATE 600+D PLUS MINERALS) 600-800 MG-UNIT CHEW Chew 1 tablet by  mouth continuous dialysis.     [provider]  fluticasone (FLONASE) 50 MCG/ACT nasal spray Place 1 spray into both nostrils 2 (two) times daily.    [provider]  ibuprofen (ADVIL) 200 MG tablet Take 400 mg by mouth every 6 (six) hours as needed (FOR PAIN.).    [provider]  imiquimod (ALDARA) 5 % cream Apply 1 application topically 3 (three) times a week. AT NIGHT.    [provider]  LORazepam (ATIVAN) 0.5 MG tablet Take 0.5 mg by mouth at bedtime.     [provider]  meloxicam (MOBIC) 7.5 MG tablet Take 1 tablet (7.5 mg total) by mouth every evening. 03/18/19   Carole Civil, MD  montelukast (SINGULAIR) 10 MG tablet Take 10 mg by mouth at bedtime.      [provider]  Multiple Vitamin (MULTIVITAMIN WITH MINERALS) TABS tablet Take 1 tablet by mouth daily.    [provider]    Family History Family History  Problem Relation Age of Onset  . Cancer Other        family history   . Diabetes Other        family history   . Heart defect Other        family history   . Dementia Mother   . Colon cancer Neg Hx     Social History Social History   Tobacco Use  . Smoking status: Never Smoker  . Smokeless tobacco: Never Used  Substance Use Topics  . Alcohol use: No  . Drug use: Never     Allergies   Ethinyl estradiol, Iodinated diagnostic agents, and Iohexol   Review of Systems Review of Systems  Constitutional: Negative for activity change, appetite change, chills, fatigue and fever.  HENT: Positive for sore throat. Negative for congestion, ear pain, rhinorrhea, sinus pressure and trouble swallowing.   Eyes: Negative for discharge and redness.  Respiratory: Negative for cough, chest tightness and shortness of breath.   Cardiovascular: Negative for chest pain.  Gastrointestinal: Negative for abdominal pain, diarrhea, nausea and vomiting.  Musculoskeletal: Negative for myalgias.  Skin: Negative for rash.  Neurological: Negative for dizziness, light-headedness and headaches.     Physical Exam Triage Vital Signs ED Triage Vitals  Enc Vitals Group     BP      Pulse      Resp      Temp      Temp src      SpO2      Weight      Height      Head Circumference      Peak Flow      Pain Score      Pain Loc      Pain Edu?      Excl. in [place]?    No data found.  Updated Vital Signs BP 110/68   Pulse (!) 59   Temp 98.2 F (36.8 C)   Resp 18   SpO2 97%   Visual Acuity Right Eye Distance:   Left Eye Distance:   Bilateral Distance:    Right Eye Near:   Left Eye Near:    Bilateral Near:     Physical Exam Vitals and nursing note reviewed.  Constitutional:      Appearance: She is well-developed.      Comments: No acute distress  HENT:     Head: Normocephalic and atraumatic.     Ears:     Comments: Bilateral ears without  tenderness to palpation of external auricle, tragus and mastoid, EAC's without erythema or swelling, TM's with good bony landmarks and cone of light. Non erythematous.     Nose: Nose normal.     Mouth/Throat:     Comments: Bilateral tonsils without enlargement or erythema, appear pink, isolated exudate noted to left tonsil, uvula midline, posterior pharynx patent, no soft palate swelling Eyes:     Conjunctiva/sclera: Conjunctivae normal.  Cardiovascular:     Rate and Rhythm: Normal rate.  Pulmonary:     Effort: Pulmonary effort is normal. No respiratory distress.     Comments: Breathing comfortably at rest, CTABL, no wheezing, rales or other adventitious sounds auscultated Abdominal:     General: There is no distension.  Musculoskeletal:        General: Normal range of motion.     Cervical back: Neck supple.  Skin:    General: Skin is warm and dry.  Neurological:     Mental Status: She is alert and oriented to person, place, and time.      UC Treatments / Results  Labs (all labs ordered are listed, but only abnormal results are displayed) Labs Reviewed  CULTURE, GROUP A STREP [facility])  POCT RAPID STREP A (OFFICE)    EKG   Radiology No results found.  Procedures Procedures (including critical care time)  Medications Ordered in UC Medications - No data to display  Initial Impression / Assessment and Plan / UC Course  I have reviewed the triage vital signs and the nursing notes.  Pertinent labs & imaging results that were available during my care of the patient were reviewed by me and considered in my medical decision making (see chart for details).    Strep test negative, strep culture pending.  Exam not suggestive of tonsillitis at this time, 1 isolated exudate, no erythema or swelling noted.  Recommending symptomatic and supportive care  and treatment of viral pharyngitis.  Did advise patient to keep a close eye on symptoms developing more swelling/redness given this is day 1 of symptoms to call on Friday was send some antibiotics.   Discussed strict return precautions. Patient verbalized understanding and is agreeable with plan.  Final Clinical Impressions(s) / UC Diagnoses   Final diagnoses:  Viral pharyngitis     Discharge Instructions     Sore Throat  Your rapid strep tested Negative today. We will send for a culture and call in about 2 days if results are positive. For now we will treat your sore throat as a virus with symptom management.   Please continue Tylenol or Ibuprofen for fever and pain. May try salt water gargles, cepacol lozenges, throat spray, or OTC cold relief medicine for throat discomfort. If you also have congestion take a daily anti-histamine like Zyrtec, Claritin, and a oral decongestant to help with post nasal drip that may be irritating your throat.   Stay hydrated and drink plenty of fluids to keep your throat coated relieve irritation.     ED Prescriptions    None     PDMP not reviewed this encounter.   Janith Lima, PA-C 04/14/19 1714

## 2019-04-14 NOTE — ED Triage Notes (Signed)
Pt presents with c/o sore throat that began this morning, pt noticed white patch on tonsil

## 2019-04-14 NOTE — Discharge Instructions (Signed)

## 2019-04-16 LAB — CULTURE, GROUP A STREP (THRC)

## 2019-04-23 ENCOUNTER — Other Ambulatory Visit: Payer: Self-pay | Admitting: Internal Medicine

## 2019-04-23 DIAGNOSIS — Z1231 Encounter for screening mammogram for malignant neoplasm of breast: Secondary | ICD-10-CM

## 2019-04-29 DIAGNOSIS — Z23 Encounter for immunization: Secondary | ICD-10-CM | POA: Diagnosis not present

## 2019-04-30 ENCOUNTER — Ambulatory Visit
Admission: RE | Admit: 2019-04-30 | Discharge: 2019-04-30 | Disposition: A | Discharge status: Home / Self Care | Payer: BC Managed Care – PPO | Source: Ambulatory Visit | Attending: Internal Medicine | Admitting: Internal Medicine

## 2019-04-30 ENCOUNTER — Other Ambulatory Visit: Payer: Self-pay

## 2019-04-30 ENCOUNTER — Other Ambulatory Visit: Payer: Self-pay | Admitting: Internal Medicine

## 2019-04-30 DIAGNOSIS — Z1231 Encounter for screening mammogram for malignant neoplasm of breast: Secondary | ICD-10-CM

## 2019-05-05 DIAGNOSIS — G4700 Insomnia, unspecified: Secondary | ICD-10-CM | POA: Diagnosis not present

## 2019-05-05 DIAGNOSIS — Z634 Disappearance and death of family member: Secondary | ICD-10-CM | POA: Diagnosis not present

## 2019-05-05 DIAGNOSIS — F419 Anxiety disorder, unspecified: Secondary | ICD-10-CM | POA: Diagnosis not present

## 2019-05-28 DIAGNOSIS — E559 Vitamin D deficiency, unspecified: Secondary | ICD-10-CM | POA: Diagnosis not present

## 2019-05-28 DIAGNOSIS — G4700 Insomnia, unspecified: Secondary | ICD-10-CM | POA: Diagnosis not present

## 2019-05-28 DIAGNOSIS — J302 Other seasonal allergic rhinitis: Secondary | ICD-10-CM | POA: Diagnosis not present

## 2019-05-28 DIAGNOSIS — F419 Anxiety disorder, unspecified: Secondary | ICD-10-CM | POA: Diagnosis not present

## 2019-05-28 DIAGNOSIS — M810 Age-related osteoporosis without current pathological fracture: Secondary | ICD-10-CM | POA: Diagnosis not present

## 2019-05-28 DIAGNOSIS — E785 Hyperlipidemia, unspecified: Secondary | ICD-10-CM | POA: Diagnosis not present

## 2019-06-02 ENCOUNTER — Other Ambulatory Visit (HOSPITAL_COMMUNITY): Payer: Self-pay | Admitting: Internal Medicine

## 2019-06-02 DIAGNOSIS — Z0001 Encounter for general adult medical examination with abnormal findings: Secondary | ICD-10-CM | POA: Diagnosis not present

## 2019-06-02 DIAGNOSIS — M810 Age-related osteoporosis without current pathological fracture: Secondary | ICD-10-CM

## 2019-06-08 ENCOUNTER — Other Ambulatory Visit: Payer: Self-pay | Admitting: Orthopedic Surgery

## 2019-06-08 DIAGNOSIS — G8929 Other chronic pain: Secondary | ICD-10-CM

## 2019-06-16 ENCOUNTER — Ambulatory Visit (HOSPITAL_COMMUNITY)
Admission: RE | Admit: 2019-06-16 | Discharge: 2019-06-16 | Disposition: A | Discharge status: Home / Self Care | Payer: BC Managed Care – PPO | Source: Ambulatory Visit | Attending: Internal Medicine | Admitting: Internal Medicine

## 2019-06-16 ENCOUNTER — Other Ambulatory Visit: Payer: Self-pay

## 2019-06-16 DIAGNOSIS — M8588 Other specified disorders of bone density and structure, other site: Secondary | ICD-10-CM | POA: Diagnosis not present

## 2019-06-16 DIAGNOSIS — M810 Age-related osteoporosis without current pathological fracture: Secondary | ICD-10-CM

## 2019-06-16 DIAGNOSIS — B078 Other viral warts: Secondary | ICD-10-CM | POA: Diagnosis not present

## 2019-07-15 DIAGNOSIS — R1030 Lower abdominal pain, unspecified: Secondary | ICD-10-CM | POA: Diagnosis not present

## 2019-07-20 DIAGNOSIS — M1811 Unilateral primary osteoarthritis of first carpometacarpal joint, right hand: Secondary | ICD-10-CM | POA: Diagnosis not present

## 2019-07-27 ENCOUNTER — Other Ambulatory Visit: Payer: Self-pay

## 2019-07-27 ENCOUNTER — Encounter (HOSPITAL_COMMUNITY)
Admission: RE | Admit: 2019-07-27 | Discharge: 2019-07-27 | Disposition: A | Discharge status: Home / Self Care | Payer: BC Managed Care – PPO | Source: Ambulatory Visit | Attending: Internal Medicine | Admitting: Internal Medicine

## 2019-07-27 ENCOUNTER — Encounter (HOSPITAL_COMMUNITY): Payer: Self-pay

## 2019-07-27 DIAGNOSIS — M810 Age-related osteoporosis without current pathological fracture: Secondary | ICD-10-CM | POA: Insufficient documentation

## 2019-07-27 MED ORDER — DENOSUMAB 60 MG/ML ~~LOC~~ SOSY
PREFILLED_SYRINGE | SUBCUTANEOUS | Status: AC
  Filled 2019-07-27: qty 1

## 2019-07-27 MED ORDER — DENOSUMAB 60 MG/ML ~~LOC~~ SOSY
60.0000 mg | PREFILLED_SYRINGE | Freq: Once | SUBCUTANEOUS | Status: AC
  Administered 2019-07-27: 60 mg via SUBCUTANEOUS

## 2019-07-30 ENCOUNTER — Ambulatory Visit: Payer: BC Managed Care – PPO | Admitting: Orthopedic Surgery

## 2019-08-27 ENCOUNTER — Other Ambulatory Visit: Payer: Self-pay | Admitting: Orthopedic Surgery

## 2019-08-27 DIAGNOSIS — G8929 Other chronic pain: Secondary | ICD-10-CM

## 2019-11-15 ENCOUNTER — Other Ambulatory Visit: Payer: Self-pay | Admitting: Orthopedic Surgery

## 2019-11-15 DIAGNOSIS — G8929 Other chronic pain: Secondary | ICD-10-CM

## 2020-01-26 ENCOUNTER — Ambulatory Visit (HOSPITAL_COMMUNITY): Payer: BC Managed Care – PPO

## 2020-02-11 ENCOUNTER — Other Ambulatory Visit: Payer: Self-pay | Admitting: Orthopedic Surgery

## 2020-02-11 DIAGNOSIS — M25561 Pain in right knee: Secondary | ICD-10-CM

## 2020-02-11 DIAGNOSIS — G8929 Other chronic pain: Secondary | ICD-10-CM

## 2020-04-09 ENCOUNTER — Emergency Department (HOSPITAL_COMMUNITY): Payer: BC Managed Care – PPO

## 2020-04-09 ENCOUNTER — Other Ambulatory Visit: Payer: Self-pay

## 2020-04-09 ENCOUNTER — Emergency Department (HOSPITAL_COMMUNITY)
Admission: EM | Admit: 2020-04-09 | Discharge: 2020-04-09 | Disposition: A | Discharge status: Home / Self Care | Payer: BC Managed Care – PPO | Attending: Emergency Medicine | Admitting: Emergency Medicine

## 2020-04-09 ENCOUNTER — Encounter (HOSPITAL_COMMUNITY): Payer: Self-pay | Admitting: Emergency Medicine

## 2020-04-09 DIAGNOSIS — M79631 Pain in right forearm: Secondary | ICD-10-CM | POA: Insufficient documentation

## 2020-04-09 DIAGNOSIS — Z96691 Finger-joint replacement of right hand: Secondary | ICD-10-CM | POA: Insufficient documentation

## 2020-04-09 DIAGNOSIS — M25531 Pain in right wrist: Secondary | ICD-10-CM | POA: Diagnosis present

## 2020-04-09 DIAGNOSIS — Y9389 Activity, other specified: Secondary | ICD-10-CM | POA: Diagnosis not present

## 2020-04-09 DIAGNOSIS — Z853 Personal history of malignant neoplasm of breast: Secondary | ICD-10-CM | POA: Insufficient documentation

## 2020-04-09 DIAGNOSIS — X501XXA Overexertion from prolonged static or awkward postures, initial encounter: Secondary | ICD-10-CM | POA: Insufficient documentation

## 2020-04-09 MED ORDER — ONDANSETRON 4 MG PO TBDP
4.0000 mg | ORAL_TABLET | Freq: Once | ORAL | Status: AC
  Administered 2020-04-09: 4 mg via ORAL
  Filled 2020-04-09: qty 1

## 2020-04-09 MED ORDER — HYDROMORPHONE HCL 1 MG/ML IJ SOLN
1.0000 mg | Freq: Once | INTRAMUSCULAR | Status: AC
  Administered 2020-04-09: 1 mg via INTRAMUSCULAR
  Filled 2020-04-09: qty 1

## 2020-04-09 NOTE — Discharge Instructions (Addendum)
As discussed, go back into the velcro splint provided for you after your hand surgery, ice and elevate as much as is comfortable for the next several days.  Continue your prednisone taper and add your robaxin prn for any muscle spasm which may also be a component of your pain.  Plan to see Dr Amedeo Plenty this week for a recheck if your symptoms persist or worsen.

## 2020-04-09 NOTE — ED Notes (Signed)
ED Provider at bedside. 

## 2020-04-09 NOTE — ED Notes (Addendum)
Entered room and introduced self to patient. Pt appears to be resting in bed, respirations are even and unlabored with equal chest rise and fall. Bed is locked in the lowest position, side rails x2, call bell within reach. Pt educated on call light use and hourly rounding, verbalized understanding and in agreement at this time. All questions and concerns voiced addressed. Refreshments offered and provided per patient request.  

## 2020-04-09 NOTE — ED Provider Notes (Signed)
[facility] Note   CSN: [id number] Arrival date & time: 04/09/20  1356     History Chief Complaint  Patient presents with  . Wrist Pain    Kaitlyn Morrison is a 65 y.o. female presenting for acute pain in her right wrist and forearm after a twisting injury which she describes reaching behind her to tucking her shirt and the wrist twisted while caught in her waistband.  She had a right thumb [facility] joint replacement 3 months ago by Dr. Amedeo Plenty and is currently in physical therapy for postsurgical treatment.  She was pain-free prior to this event which occurred today.  She has taken oxycodone 10 mg prior to arrival with no improvement in pain.  The pain is throbbing in character and sometimes radiates to her mid forearm.  She is concerned she has torn a tendon.  She states her surgery involved using a tendon to repair  the joint space.  She denies numbness in her fingers.  The history is provided by the patient.       Past Medical History:  Diagnosis Date  . Breast cancer ([place]) 2000   right  . 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  . Diverticulosis   . History of shingles   . Hypercholesteremia   . Hyperlipidemia   . Iron deficiency   . Iron deficiency anemia 07/19/2010  . Necrosis, breast fat   . Osteopenia   . Pars flaccida    lumbar back  . PE (pulmonary embolism)   . Pulled muscle may 2013   back  . Pulmonary embolism ([place]) 07/19/2010  . Tendonitis of elbow, right    History of    Patient Active Problem List   Diagnosis Date Noted  . Hx of colonic polyps 08/07/2018  . Gastroesophageal reflux disease without esophagitis 01/02/2017  . 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 Iron deficiency anemia 07/19/2010  . Pulmonary embolism ([place]) 07/19/2010  . Tendonitis of elbow, right   . DERANGEMENT OF POSTERIOR HORN OF MEDIAL MENISCUS 02/27/2010  .  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 ILIOTIBIAL BAND SYNDROME, LEFT KNEE 02/27/2010    Past Surgical History:  Procedure Laterality Date  . COLONOSCOPY N/A 11/05/2018   Procedure: COLONOSCOPY;  Surgeon: Rogene Houston, MD;  [facility];  Service: Endoscopy;  Laterality: N/A;  1030  . EYE SURGERY    . LAPAROSCOPIC NISSEN FUNDOPLICATION  6761  . MASTECTOMY     right mastectomy with TRAM  . MASTECTOMY W/ NODES PARTIAL     had right partial mastectomy with sentinel lymph node biopsy  . POLYPECTOMY  11/05/2018   Procedure: POLYPECTOMY;  Surgeon: Rogene Houston, MD;  [facility];  Service: Endoscopy;;  colon  . THROAT SURGERY     left vocal cord implants  . TRAM    . vocal cord surgery     left     OB History   No obstetric history on file.     Family History  Problem Relation Age of Onset  . Cancer Other        family history   . Diabetes Other        family history   . Heart defect Other        family history   . Dementia Mother   . Breast cancer Maternal Aunt   . Colon cancer  Neg Hx     Social History   Tobacco Use  . Smoking status: Never Smoker  . Smokeless tobacco: Never Used  Vaping Use  . Vaping Use: Never used  Substance Use Topics  . Alcohol use: No  . Drug use: Never    Home Medications Prior to Admission medications   Medication Sig Start Date End Date Taking? Authorizing Provider  acetaminophen (TYLENOL) 650 MG CR tablet Take 1,300 mg by mouth every 8 (eight) hours as needed for pain.    [provider]  alendronate (FOSAMAX) 70 MG tablet Take 70 mg by mouth every Wednesday.  02/06/10   [provider]  amitriptyline (ELAVIL) 10 MG tablet at bedtime.  04/14/10   [provider]  atorvastatin (LIPITOR) 10 MG tablet Take 10 mg by mouth at bedtime.     [provider]  Calcium Carbonate-Vit D-Min (CALTRATE 600+D PLUS MINERALS) 600-800 MG-UNIT CHEW Chew 1 tablet by mouth continuous dialysis.      [provider]  diclofenac Sodium (VOLTAREN) 1 % GEL Apply 2 g topically 4 (four) times daily.    [provider]  escitalopram (LEXAPRO) 5 MG tablet Take 5 mg by mouth daily.    [provider]  fluticasone (FLONASE) 50 MCG/ACT nasal spray Place 1 spray into both nostrils 2 (two) times daily.    [provider]  ibuprofen (ADVIL) 200 MG tablet Take 400 mg by mouth every 6 (six) hours as needed (FOR PAIN.).    [provider]  imiquimod (ALDARA) 5 % cream Apply 1 application topically 3 (three) times a week. AT NIGHT.    [provider]  LORazepam (ATIVAN) 0.5 MG tablet Take 0.5 mg by mouth at bedtime.     [provider]  meloxicam (MOBIC) 7.5 MG tablet TAKE 1 TABLET BY MOUTH EVERY EVENING. 02/11/20   Carole Civil, MD  montelukast (SINGULAIR) 10 MG tablet Take 10 mg by mouth at bedtime.     [provider]  Multiple Vitamin (MULTIVITAMIN WITH MINERALS) TABS tablet Take 1 tablet by mouth daily.    [provider]  Turmeric (QC TUMERIC COMPLEX) 500 MG CAPS Take 1,000 mg by mouth.    [provider]    Allergies    Ethinyl estradiol, Iodinated diagnostic agents, and Iohexol  Review of Systems   Review of Systems  Constitutional: Negative for fever.  Musculoskeletal: Positive for arthralgias. Negative for joint swelling and myalgias.  Skin: Negative.   Neurological: Negative for weakness and numbness.  All other systems reviewed and are negative.   Physical Exam Updated Vital Signs BP (!) 108/54 (BP Location: Left Arm)   Pulse (!) 54   Temp 97.8 F (36.6 C) (Oral)   Resp 16   SpO2 98%   Physical Exam Constitutional:      Appearance: She is well-developed.  HENT:     Head: Atraumatic.  Cardiovascular:     Comments: Pulses equal bilaterally Musculoskeletal:        General: Tenderness present. No swelling or deformity.     Right forearm: Tenderness present.     Right wrist:  Tenderness present.     Cervical back: Normal range of motion.     Comments: Patient is quite tender to palpation along the right radial wrist to mid forearm.  There is no palpable deformity, erythema or edema.  Distal sensation is intact with less than 2-second cap refill.  Pain is worst at the right [facility].  Radial pulses  intact.  Skin:    General: Skin is warm and dry.  Neurological:     Mental Status: She is alert.     Sensory: No sensory deficit.     Deep Tendon Reflexes: Reflexes normal.     ED Results / Procedures / Treatments   Labs (all labs ordered are listed, but only abnormal results are displayed) Labs Reviewed - No data to display  EKG None  Radiology DG Forearm Right  Result Date: 04/09/2020 CLINICAL DATA:  Right wrist and forearm pain. History of recent thumb surgery EXAM: RIGHT FOREARM - 2 VIEW; RIGHT WRIST - COMPLETE 3+ VIEW COMPARISON:  None. FINDINGS: Postsurgical changes of trapeziectomy. No evidence of acute fracture or malalignment. The remaining carpal bones are intact. Carpal intraosseous spaces are maintained. Radius and ulna are intact without fracture or malalignment. Mild soft tissue prominence of the wrist. No soft tissue mineralization. No soft tissue gas. IMPRESSION: 1. No acute osseous abnormality of the right forearm or right wrist. 2. Mild soft tissue prominence of the wrist. 3. Postsurgical changes of trapeziectomy. Electronically Signed   By: Davina Poke D.O.   On: 04/09/2020 16:13   DG Wrist Complete Right  Result Date: 04/09/2020 CLINICAL DATA:  Right wrist and forearm pain. History of recent thumb surgery EXAM: RIGHT FOREARM - 2 VIEW; RIGHT WRIST - COMPLETE 3+ VIEW COMPARISON:  None. FINDINGS: Postsurgical changes of trapeziectomy. No evidence of acute fracture or malalignment. The remaining carpal bones are intact. Carpal intraosseous spaces are maintained. Radius and ulna are intact without fracture or malalignment. Mild soft tissue prominence of  the wrist. No soft tissue mineralization. No soft tissue gas. IMPRESSION: 1. No acute osseous abnormality of the right forearm or right wrist. 2. Mild soft tissue prominence of the wrist. 3. Postsurgical changes of trapeziectomy. Electronically Signed   By: Davina Poke D.O.   On: 04/09/2020 16:13    Procedures Procedures   Medications Ordered in ED Medications  HYDROmorphone (DILAUDID) injection 1 mg (1 mg Intramuscular Given 04/09/20 1616)  ondansetron (ZOFRAN-ODT) disintegrating tablet 4 mg (4 mg Oral Given 04/09/20 1616)    ED Course  I have reviewed the triage vital signs and the nursing notes.  Pertinent labs & imaging results that were available during my care of the patient were reviewed by me and considered in my medical decision making (see chart for details).    MDM Rules/Calculators/A&P                          Pt is already taking oxycodone 10 mg which has not relieved her pain.  Dilaudid 1 mg IM given.  Imaging obtained and negative for fracture/dislocation.  No obvious soft tissue injury and no deformity or edema noted on exam.  Discussed with Dr. Onnie Graham, on call for Dr.Gramig.  Advised pt go back into a wrist splint (pt still has her custom molded splint made for her post op which she will use).  Suspected soft tissue strain. F/u Dr Amedeo Plenty this week.  Ice and elevation. Pt is also currently on prednisone taper, advised to continue. Pt's pain much improved at time of dc.  Final Clinical Impression(s) / ED Diagnoses Final diagnoses:  Right wrist pain    Rx / DC Orders ED Discharge Orders    None       Landis Martins 04/11/20 1308    Truddie Hidden, MD 04/11/20 [phone]

## 2020-04-09 NOTE — ED Notes (Signed)
X-Ray at bedside.

## 2020-04-09 NOTE — ED Triage Notes (Signed)
Pt complains of right wrist pain. She has had a recent surgery on that hand by Dr. Amedeo Plenty who has been contacted. Pt is waiting for a return call.

## 2020-05-03 ENCOUNTER — Telehealth: Payer: Self-pay | Admitting: Radiology

## 2020-05-03 ENCOUNTER — Other Ambulatory Visit: Payer: Self-pay | Admitting: Orthopedic Surgery

## 2020-05-03 DIAGNOSIS — M25561 Pain in right knee: Secondary | ICD-10-CM

## 2020-05-03 DIAGNOSIS — G8929 Other chronic pain: Secondary | ICD-10-CM

## 2020-05-03 NOTE — Telephone Encounter (Signed)
Presription Refill from [facility] already filled.

## 2020-05-10 ENCOUNTER — Other Ambulatory Visit: Payer: Self-pay | Admitting: Radiology

## 2020-05-10 DIAGNOSIS — G8929 Other chronic pain: Secondary | ICD-10-CM

## 2020-05-10 DIAGNOSIS — M25561 Pain in right knee: Secondary | ICD-10-CM

## 2020-05-10 MED ORDER — MELOXICAM 7.5 MG PO TABS: ORAL_TABLET | ORAL | 1 refills | Status: DC

## 2020-05-10 NOTE — Telephone Encounter (Signed)
Refill request received via fax for  Meloxicam from [facility]

## 2020-05-27 ENCOUNTER — Other Ambulatory Visit: Payer: Self-pay | Admitting: Internal Medicine

## 2020-05-27 DIAGNOSIS — Z1231 Encounter for screening mammogram for malignant neoplasm of breast: Secondary | ICD-10-CM

## 2020-07-18 ENCOUNTER — Other Ambulatory Visit: Payer: Self-pay

## 2020-07-18 ENCOUNTER — Ambulatory Visit
Admission: RE | Admit: 2020-07-18 | Discharge: 2020-07-18 | Disposition: A | Discharge status: Home / Self Care | Payer: BC Managed Care – PPO | Source: Ambulatory Visit | Attending: Internal Medicine | Admitting: Internal Medicine

## 2020-07-18 DIAGNOSIS — Z1231 Encounter for screening mammogram for malignant neoplasm of breast: Secondary | ICD-10-CM

## 2020-09-15 DIAGNOSIS — J329 Chronic sinusitis, unspecified: Secondary | ICD-10-CM | POA: Insufficient documentation

## 2020-10-14 ENCOUNTER — Other Ambulatory Visit: Payer: Self-pay | Admitting: Orthopedic Surgery

## 2020-10-14 DIAGNOSIS — G8929 Other chronic pain: Secondary | ICD-10-CM

## 2020-10-14 DIAGNOSIS — M25561 Pain in right knee: Secondary | ICD-10-CM

## 2020-12-09 DIAGNOSIS — F419 Anxiety disorder, unspecified: Secondary | ICD-10-CM | POA: Insufficient documentation

## 2021-01-24 DIAGNOSIS — R059 Cough, unspecified: Secondary | ICD-10-CM | POA: Insufficient documentation

## 2021-01-31 ENCOUNTER — Ambulatory Visit (INDEPENDENT_AMBULATORY_CARE_PROVIDER_SITE_OTHER): Payer: BC Managed Care – PPO

## 2021-01-31 ENCOUNTER — Ambulatory Visit
Admission: EM | Admit: 2021-01-31 | Discharge: 2021-01-31 | Disposition: A | Discharge status: Home / Self Care | Payer: BC Managed Care – PPO | Attending: Urgent Care | Admitting: Urgent Care

## 2021-01-31 ENCOUNTER — Encounter: Payer: Self-pay | Admitting: Emergency Medicine

## 2021-01-31 ENCOUNTER — Other Ambulatory Visit: Payer: Self-pay

## 2021-01-31 DIAGNOSIS — M79672 Pain in left foot: Secondary | ICD-10-CM

## 2021-01-31 DIAGNOSIS — S92355A Nondisplaced fracture of fifth metatarsal bone, left foot, initial encounter for closed fracture: Secondary | ICD-10-CM

## 2021-01-31 NOTE — ED Triage Notes (Signed)
Felt something pop in left foot while walking on Sunday.

## 2021-01-31 NOTE — ED Provider Notes (Signed)
[place]   MRN: [id number] DOB: 1955/06/27  Subjective:   Kaitlyn Morrison is a 65 y.o. female presenting for 2-day history of acute onset persistent and worsening left foot pain.  Patient states she was walking and felt a loud pop in her left foot.  She has since had persistent pain and difficulty bearing weight on that area.  No current facility-administered medications for this encounter.  Current Outpatient Medications:    acetaminophen (TYLENOL) 650 MG CR tablet, Take 1,300 mg by mouth every 8 (eight) hours as needed for pain., Disp: , Rfl:    alendronate (FOSAMAX) 70 MG tablet, Take 70 mg by mouth every Wednesday. , Disp: , Rfl:    amitriptyline (ELAVIL) 10 MG tablet, at bedtime. , Disp: , Rfl:    atorvastatin (LIPITOR) 10 MG tablet, Take 10 mg by mouth at bedtime. , Disp: , Rfl:    Calcium Carbonate-Vit D-Min (CALTRATE 600+D PLUS MINERALS) 600-800 MG-UNIT CHEW, Chew 1 tablet by mouth continuous dialysis. , Disp: , Rfl:    diclofenac Sodium (VOLTAREN) 1 % GEL, Apply 2 g topically 4 (four) times daily., Disp: , Rfl:    escitalopram (LEXAPRO) 5 MG tablet, Take 5 mg by mouth daily., Disp: , Rfl:    fluticasone (FLONASE) 50 MCG/ACT nasal spray, Place 1 spray into both nostrils 2 (two) times daily., Disp: , Rfl:    ibuprofen (ADVIL) 200 MG tablet, Take 400 mg by mouth every 6 (six) hours as needed (FOR PAIN.)., Disp: , Rfl:    imiquimod (ALDARA) 5 % cream, Apply 1 application topically 3 (three) times a week. AT NIGHT., Disp: , Rfl:    LORazepam (ATIVAN) 0.5 MG tablet, Take 0.5 mg by mouth at bedtime. , Disp: , Rfl:    meloxicam (MOBIC) 7.5 MG tablet, TAKE 1 TABLET DAILY IN THE EVENING, Disp: 90 tablet, Rfl: 3   montelukast (SINGULAIR) 10 MG tablet, Take 10 mg by mouth at bedtime. 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   Diverticulosis    History of shingles    Hypercholesteremia    Hyperlipidemia    Iron deficiency    Iron deficiency anemia 07/19/2010   Necrosis, breast fat    Osteopenia    Pars flaccida    lumbar back   PE (pulmonary embolism)    Pulled muscle may 2013   back   Pulmonary embolism ([place]) 07/19/2010   Tendonitis of elbow, right    History of     Past Surgical History:  Procedure Laterality Date   COLONOSCOPY N/A 11/05/2018   Procedure: COLONOSCOPY;  Surgeon: Rogene Houston, MD;  [facility];  Service: Endoscopy;  Laterality: N/A;  1030   EYE SURGERY     LAPAROSCOPIC NISSEN FUNDOPLICATION  1950   MASTECTOMY     right mastectomy with  TRAM   MASTECTOMY W/ NODES PARTIAL     had right partial mastectomy with sentinel lymph node biopsy   POLYPECTOMY  11/05/2018   Procedure: POLYPECTOMY;  Surgeon: Rogene Houston, MD;  Location[facility];  Service: Endoscopy;;  colon   THROAT SURGERY     left vocal cord implants   TRAM     vocal cord surgery     left    Family History  Problem Relation Age of Onset   Cancer Other        family history    Diabetes Other        family history    Heart defect Other        family history    Dementia Mother    Breast cancer Maternal Aunt    Colon cancer Neg Hx     Social History   Tobacco Use   Smoking status: Never   Smokeless tobacco: Never  Vaping Use   Vaping Use: Never used  Substance Use Topics   Alcohol use: No   Drug use: Never    ROS   Objective:   Vitals: BP 122/74 (BP Location: Right Arm)     Pulse 75    Temp 98.2 F (36.8 C) (Oral)    Resp 18    SpO2 96%   Physical Exam Constitutional:      General: She is not in acute distress.    Appearance: Normal appearance. She is well-developed. She is not ill-appearing.  HENT:     Head: Normocephalic and atraumatic.     Nose: Nose normal.     Mouth/Throat:     Mouth: Mucous membranes are moist.     Pharynx: Oropharynx is clear.  Eyes:     General: No scleral icterus.    Extraocular Movements: Extraocular movements intact.     Pupils: Pupils are equal, round, and reactive to light.  Cardiovascular:     Rate and Rhythm: Normal rate.  Pulmonary:     Effort: Pulmonary effort is normal.  Musculoskeletal:     Left foot: Normal range of motion and normal capillary refill. Swelling, tenderness (Over area outlined) and bony tenderness present. No deformity, laceration or crepitus.       Feet:  Skin:    General: Skin is warm and dry.  Neurological:     General: No focal deficit present.     Mental Status: She is alert and oriented to person, place, and time.  Psychiatric:        Mood and Affect: Mood normal.        Behavior: Behavior normal.    DG Foot Complete Left  Result Date: 01/31/2021 CLINICAL DATA:  felt some thing pop, pain EXAM: LEFT FOOT - COMPLETE 3+ VIEW COMPARISON:  None. FINDINGS: Transverse fracture of the proximal right fifth metatarsal which appears to have corticated margins. This fracture is no joint malalignment. First MTP degenerative change. IMPRESSION: Transverse fracture of the proximal right fifth metatarsal, age indeterminate but possibly remote fracture. Recommend correlation with the presence or absence of point tenderness. Electronically Signed   By: Margaretha Sheffield M.D.   On: 01/31/2021 17:21    A posterior splint was applied to the left lower leg and secured with an Ace wrap.  Crutches provided to the patient.  Assessment and Plan :   I have reviewed the PDMP during this encounter.  1. Closed  nondisplaced fracture of fifth metatarsal bone of left foot, initial encounter   2.  Left foot pain    Splint applied as applied as above.  Recommended that she use her regular narcotic pain medication.  Use crutches to ambulate.  Follow-up with podiatry as soon as possible. Counseled patient on potential for adverse effects with medications prescribed/recommended today, ER and return-to-clinic precautions discussed, patient verbalized understanding.    Jaynee Eagles, PA-C 01/31/21 1751

## 2021-02-01 ENCOUNTER — Ambulatory Visit: Payer: BC Managed Care – PPO | Admitting: Podiatry

## 2021-02-01 DIAGNOSIS — S92352D Displaced fracture of fifth metatarsal bone, left foot, subsequent encounter for fracture with routine healing: Secondary | ICD-10-CM

## 2021-02-03 ENCOUNTER — Encounter: Payer: Self-pay | Admitting: Podiatry

## 2021-02-03 NOTE — Progress Notes (Signed)
Subjective:  Patient ID: Kaitlyn Morrison, female    DOB: 10-25-1955,  MRN: [id number]  Chief Complaint  Patient presents with   Foot Pain    Left foot fracture     66 y.o. female presents with the above complaint.  Patient presents with left fifth metatarsal fracture.  She states this injury occurred last Sunday on 01/29/2021.  She states that this happened while bowling and walking on the bowling shoe.  She states is painful to touch painful to walk on.  She went to urgent care and had it evaluated and told her she had a fracture.  She has not been 1 week in a boot she has been ambulating nonweightbearing with a splint.  She wanted to get evaluated make sure that she does not need surgery.  She would like to discuss treatment options for this.   Review of Systems: Negative except as noted in the HPI. Denies N/V/F/Ch.  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 , Disp: , Rfl:    amitriptyline (ELAVIL) 10 MG tablet, at bedtime. , Disp: , Rfl:    atorvastatin (LIPITOR) 10 MG tablet, Take 10 mg by mouth at bedtime. , Disp: , Rfl:    Calcium Carbonate-Vit D-Min (CALTRATE 600+D PLUS MINERALS) 600-800 MG-UNIT CHEW, Chew 1 tablet by mouth continuous dialysis. , Disp: , Rfl:    diclofenac Sodium (VOLTAREN) 1 % GEL, Apply 2  g topically 4 (four) times daily., Disp: , Rfl:    escitalopram (LEXAPRO) 5 MG tablet, Take 5 mg by mouth daily., Disp: , Rfl:    fluticasone (FLONASE) 50 MCG/ACT nasal spray, Place 1 spray into both nostrils 2 (two) times daily., Disp: , Rfl:    ibuprofen (ADVIL) 200 MG tablet, Take 400 mg by mouth every 6 (six) hours as needed (FOR PAIN.)., Disp: , Rfl:    imiquimod (ALDARA) 5 % cream, Apply 1 application topically 3 (three) times a week. AT NIGHT., Disp: , Rfl:    LORazepam (ATIVAN) 0.5 MG tablet, Take 0.5 mg by mouth at bedtime. , Disp: , Rfl:    meloxicam (MOBIC) 7.5 MG tablet, TAKE 1 TABLET DAILY IN THE EVENING, Disp: 90 tablet, Rfl: 3   montelukast (SINGULAIR) 10 MG tablet, Take 10 mg by mouth at bedtime. , Disp: , Rfl:    Multiple Vitamin (MULTIVITAMIN WITH MINERALS) TABS tablet, Take 1 tablet by mouth daily., Disp: , Rfl:    Turmeric (QC TUMERIC COMPLEX) 500 MG CAPS, Take 1,000 mg by mouth., Disp: , Rfl:   Social History   Tobacco Use  Smoking Status Never  Smokeless Tobacco Never    Allergies  Allergen Reactions  Ethinyl Estradiol     Developed Pulmonary Embolus while on this med   Iodinated Contrast Media Swelling and Nausea Only   Iohexol      Desc: FACIAL SWELLING, STOMACH CRAMPS, PT NEEDS 13 HR PRE MEDICATION    Objective:  There were no vitals filed for this visit. There is no height or weight on file to calculate BMI. Constitutional Well developed. Well nourished.  Vascular Dorsalis pedis pulses palpable bilaterally. Posterior tibial pulses palpable bilaterally. Capillary refill normal to all digits.  No cyanosis or clubbing noted. Pedal hair growth normal.  Neurologic Normal speech. Oriented to person, place, and time. Epicritic sensation to light touch grossly present bilaterally.  Dermatologic Nails well groomed and normal in appearance. No open wounds. No skin lesions.  Orthopedic: Pain on palpation left fifth metatarsal base.  No pain at the insertion  of the peroneal tendon.  No pain at the rest of the shaft of the fifth metatarsal.  No pain with range of motion metatarsophalangeal joint fifth.   Radiographs: 3 views of skeletally mature adult left foot prior x-ray reviewedTransverse fracture of the proximal right fifth metatarsal, age indeterminate but possibly remote fracture. Recommend correlation with the presence or absence of point tenderness. Assessment:   1. Closed fracture of base of fifth metatarsal bone with routine healing, left    Plan:  Patient was evaluated and treated and all questions answered.  Left fifth metatarsal fracture nondisplaced Jones fracture -I explained to the patient the etiology of fracture versus treatment options were extensively discussed -He has a very minimally displaced fracture I believe patient will benefit from cam boot immobilization with weightbearing as tolerated in a boot at all times.  There is no displacement.  If for some reason her pain is not resolved we can discuss surgical options at this time.  For now she will benefit from conservative treatment options. -Cam boot was dispensed -We will continue to monitor the clinical pain of the patient as the fracture can take many months to resolve.  I discussed this with the patient she states understanding  No follow-ups on file.

## 2021-02-04 ENCOUNTER — Other Ambulatory Visit: Payer: Self-pay | Admitting: Orthopedic Surgery

## 2021-02-04 DIAGNOSIS — M25561 Pain in right knee: Secondary | ICD-10-CM

## 2021-02-04 DIAGNOSIS — G8929 Other chronic pain: Secondary | ICD-10-CM

## 2021-02-08 ENCOUNTER — Ambulatory Visit: Payer: BC Managed Care – PPO | Admitting: Orthopedic Surgery

## 2021-02-08 ENCOUNTER — Other Ambulatory Visit: Payer: Self-pay

## 2021-02-08 VITALS — BP 101/60 | HR 72 | Ht 64.0 in | Wt 130.0 lb

## 2021-02-08 DIAGNOSIS — S92355A Nondisplaced fracture of fifth metatarsal bone, left foot, initial encounter for closed fracture: Secondary | ICD-10-CM | POA: Diagnosis not present

## 2021-02-08 NOTE — Progress Notes (Signed)
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    Chief Complaint  Patient presents with   Foot Pain    Left, pain started New Years Day    66 year old female twisted her left foot early December did well after that and then she was bowling on New Year's Day and felt something pop as she walked up to take her turn.  She was seen in urgent care x-rays show a sclerotic fracture fifth metatarsal where she complains of pain  She was placed in a cam walker which does not fit she did try an even up because she was having pain in her back from a pars defect.  That did not seem to help with the back pain  She comes in complaining of pain over the lateral aspect of the fifth metatarsal of the left foot   Review of Systems  All other systems reviewed and are negative.   Body mass index is 22.31 kg/m.  Physical Exam Constitutional:      General: She is not in acute distress.    Appearance: She is well-developed.     Comments: Well developed, well nourished Normal grooming and hygiene     Cardiovascular:     Comments: No peripheral edema Musculoskeletal:     Comments: Tenderness at the fifth metatarsal no deformity no swelling no ecchymosis no atrophy in the foot  Skin:    General: Skin is warm and dry.  Neurological:     Mental Status: She is alert and oriented to person, place, and time.     Sensory: No sensory deficit.     Coordination: Coordination normal.     Gait: Gait normal.      Deep Tendon Reflexes: Reflexes are normal and symmetric.  Psychiatric:        Mood and Affect: Mood normal.        Behavior: Behavior normal.        Thought Content: Thought content normal.        Judgment: Judgment normal.     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   Diverticulosis    History of shingles    Hypercholesteremia    Hyperlipidemia    Iron deficiency    Iron deficiency anemia 07/19/2010   Necrosis, breast fat    Osteopenia    Pars flaccida    lumbar back   PE (pulmonary embolism)    Pulled muscle may 2013   back   Pulmonary embolism ([place]) 07/19/2010   Tendonitis of elbow, right    History of   Past Surgical History:  Procedure Laterality Date   COLONOSCOPY N/A 11/05/2018   Procedure: COLONOSCOPY;  Surgeon: Rogene Houston, MD;  [facility];  Service: Endoscopy;  Laterality: N/A;  70   EYE SURGERY     LAPAROSCOPIC NISSEN FUNDOPLICATION  6387   MASTECTOMY     right mastectomy with TRAM   MASTECTOMY W/ NODES PARTIAL     had right partial mastectomy with sentinel lymph node biopsy   POLYPECTOMY  11/05/2018   Procedure: POLYPECTOMY;  Surgeon: Rogene Houston, MD;  [facility];  Service: Endoscopy;;  colon   THROAT SURGERY     left vocal cord implants   TRAM     vocal cord surgery     left   Social History   Tobacco Use   Smoking status: Never   Smokeless tobacco: Never  Vaping Use   Vaping Use: Never used  Substance Use Topics   Alcohol use: No   Drug use: Never     Assessment and Plan: See above  Encounter Diagnosis  Name Primary?   Closed nondisplaced fracture of fifth metatarsal bone of left foot, initial encounter Yes    X-ray in 6 weeks stay nonweightbearing

## 2021-02-08 NOTE — Patient Instructions (Signed)
No weight-bearing

## 2021-02-09 ENCOUNTER — Telehealth: Payer: Self-pay | Admitting: *Deleted

## 2021-02-09 NOTE — Telephone Encounter (Signed)
Patient is wanting to return a too large boot received, has already received a smaller one, does not need this one. Please advise.

## 2021-02-09 NOTE — Telephone Encounter (Signed)
I am returning your call.  How can I help you?  "I need to return a boot that you all gave me last week."  Is there something wrong with the boot?  "It's too big."  Have you worn the boot?  "Of course I've worn the boot, I wore it out."  Okay, just return the boot.  "I'll bring it by tomorrow."

## 2021-03-15 ENCOUNTER — Ambulatory Visit: Payer: BC Managed Care – PPO | Admitting: Podiatry

## 2021-03-22 ENCOUNTER — Other Ambulatory Visit: Payer: Self-pay

## 2021-03-22 ENCOUNTER — Ambulatory Visit (INDEPENDENT_AMBULATORY_CARE_PROVIDER_SITE_OTHER): Payer: BC Managed Care – PPO | Admitting: Orthopedic Surgery

## 2021-03-22 ENCOUNTER — Ambulatory Visit: Payer: BC Managed Care – PPO

## 2021-03-22 DIAGNOSIS — S92355A Nondisplaced fracture of fifth metatarsal bone, left foot, initial encounter for closed fracture: Secondary | ICD-10-CM

## 2021-03-22 DIAGNOSIS — S92355D Nondisplaced fracture of fifth metatarsal bone, left foot, subsequent encounter for fracture with routine healing: Secondary | ICD-10-CM

## 2021-03-22 NOTE — Progress Notes (Signed)
Chief Complaint  Patient presents with   Foot Injury    Fracture left foot / had injury in December     Encounter Diagnosis  Name Primary?   Closed nondisplaced fracture of fifth metatarsal bone of left foot, initial encounter Yes    66 year old female with osteopenia currently on Prolia as a stress fracture of the fifth metatarsal she is in a cam walker nonweightbearing with a scooter  X-ray today shows incomplete resolution of the fracture and she still has tenderness over the fracture site however the fracture is improving in terms of consolidation and we will allow her to weight-bear as tolerated in the cam walker she can stop using the scooter  X-ray in 3 weeks

## 2021-03-29 IMAGING — DX DG WRIST COMPLETE 3+V*R*
3 series · 3 of 3 positions shown · non-contrast
Comparison: None.

CLINICAL DATA: Right wrist and forearm pain. History of recent
thumb surgery

EXAM:
RIGHT FOREARM - 2 VIEW; RIGHT WRIST - COMPLETE 3+ VIEW

[wrist ap]
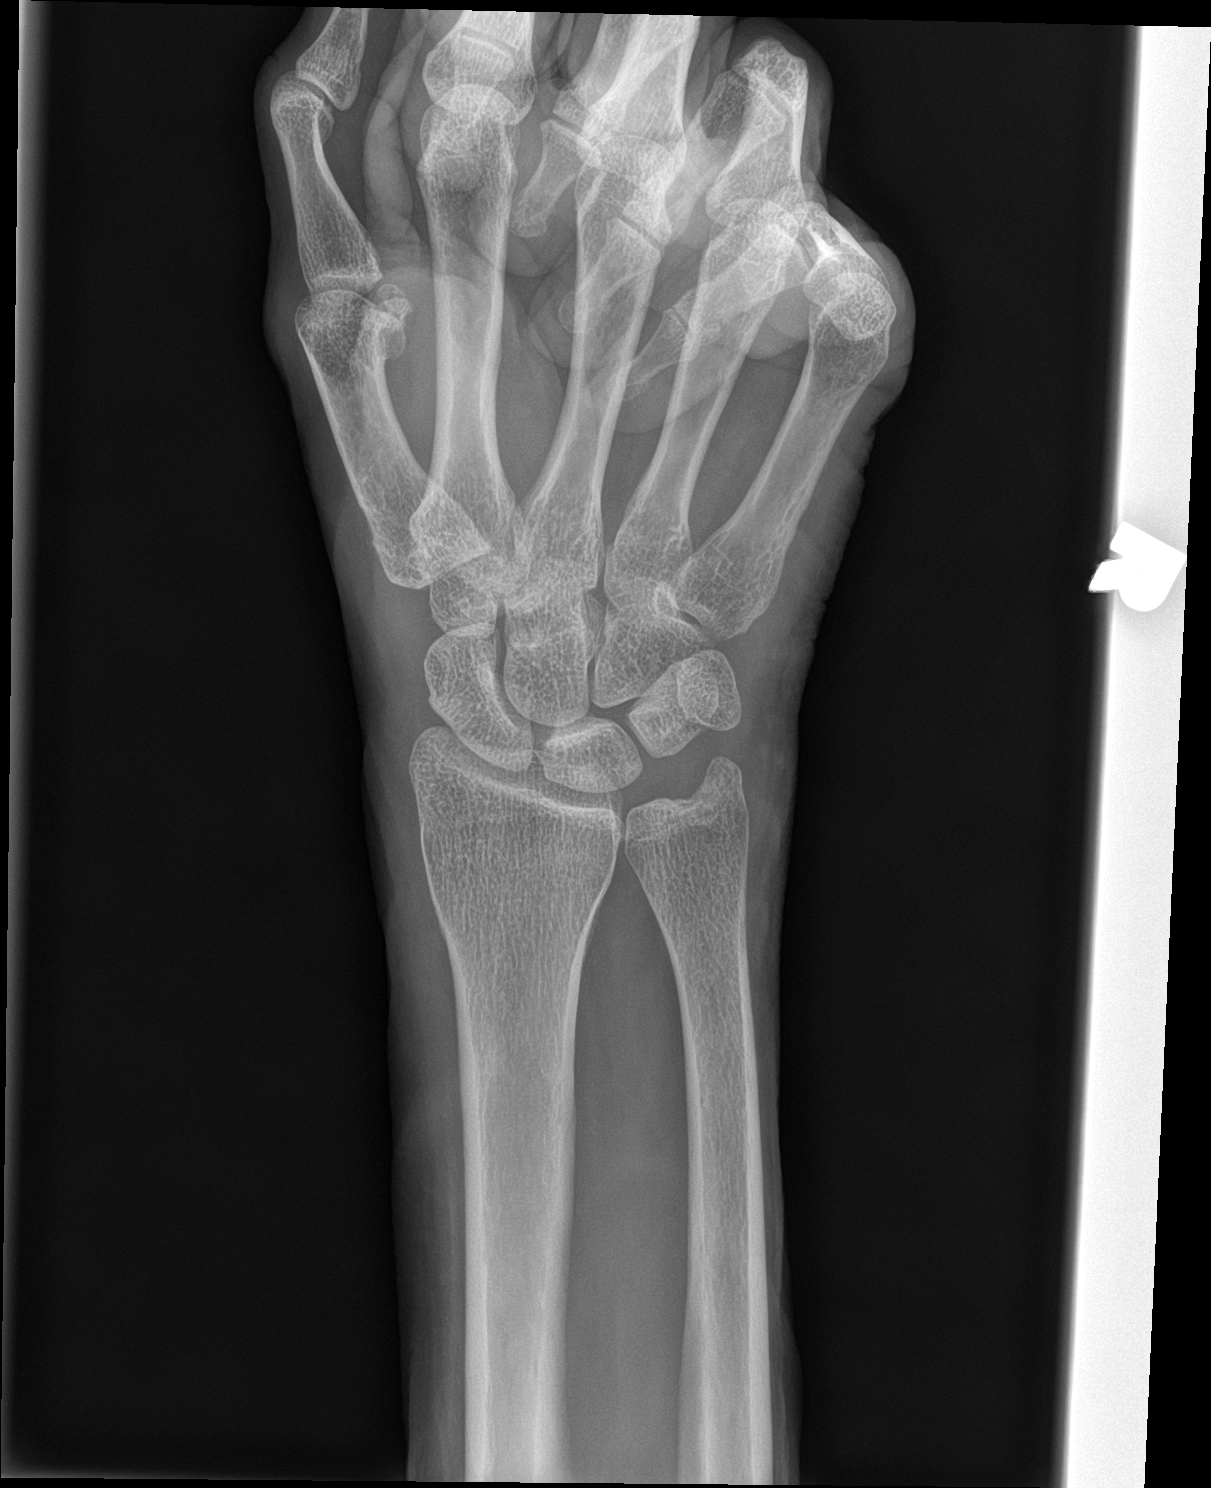

[wrist obl]
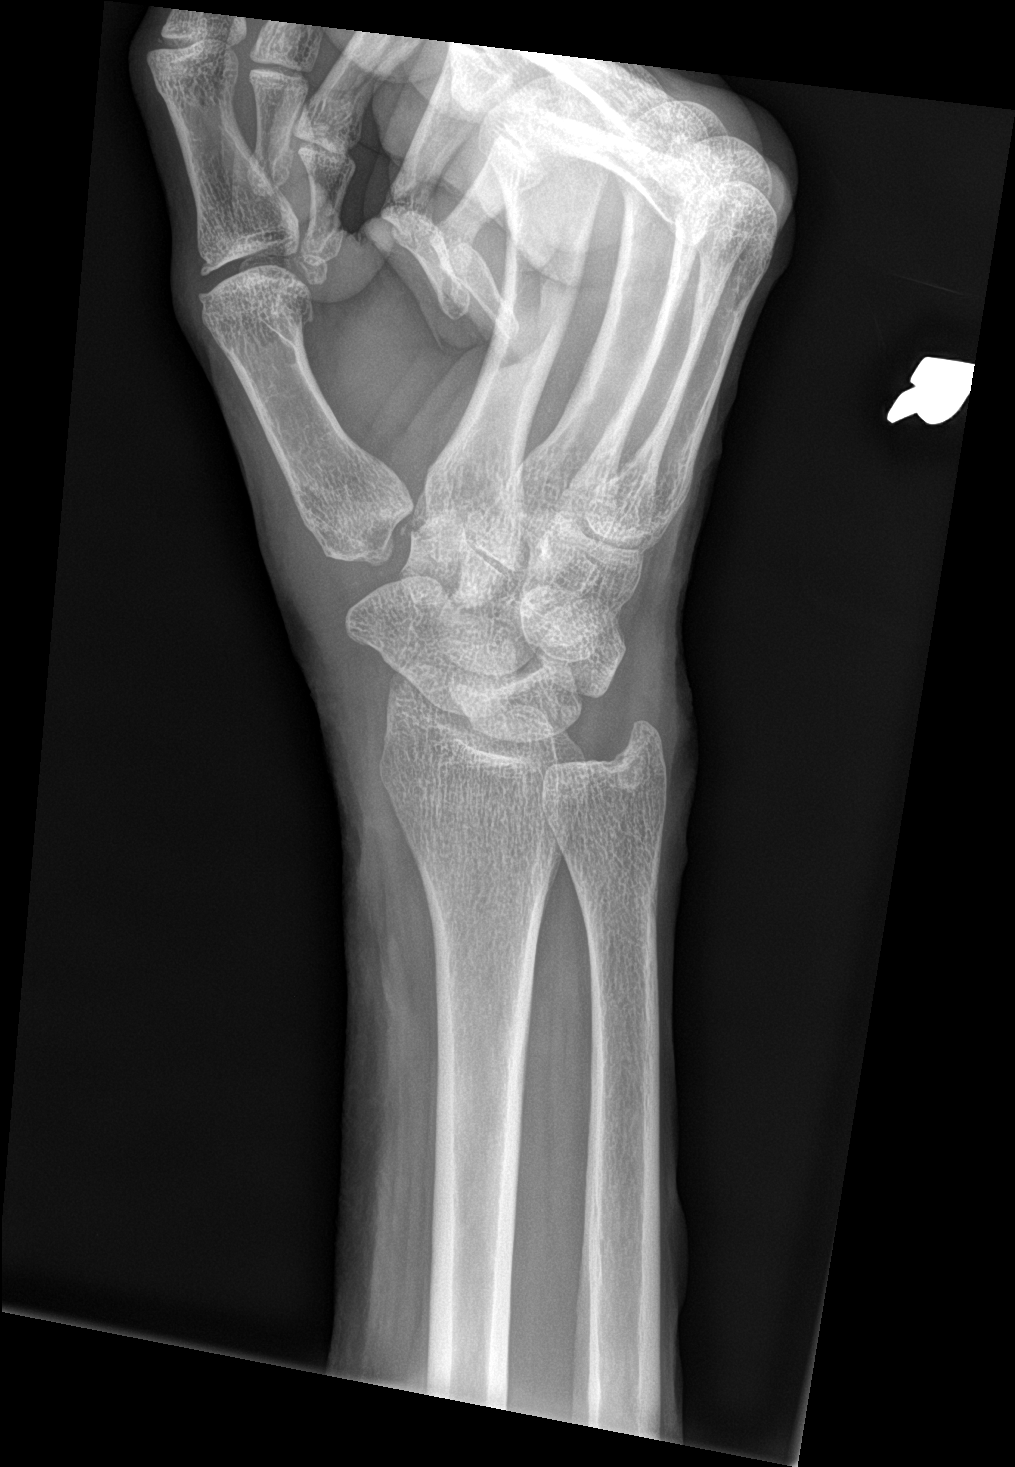

[wrist lat]
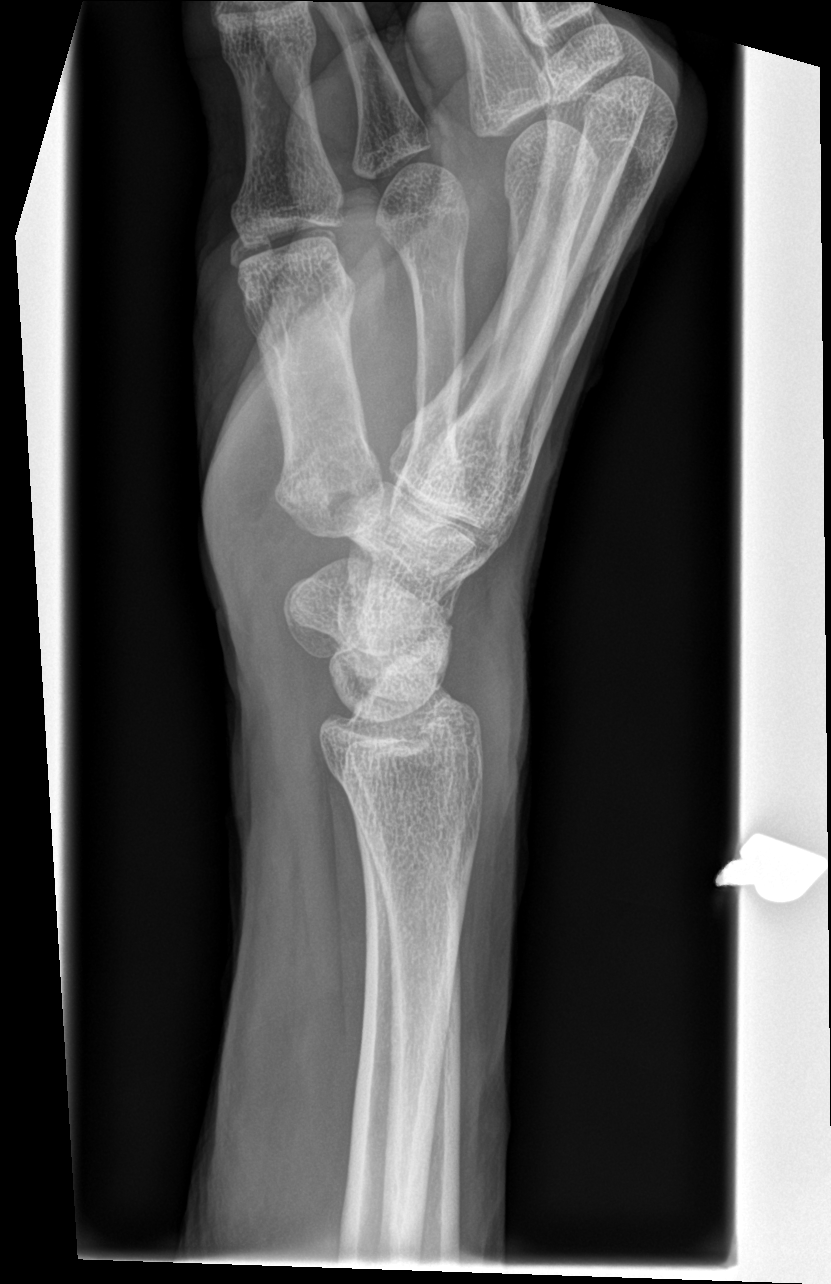

[3 of 3 positions shown; findings below may reference images not displayed]

FINDINGS: Postsurgical changes of trapeziectomy. No evidence of acute fracture
or malalignment. The remaining carpal bones are intact. Carpal
intraosseous spaces are maintained. Radius and ulna are intact
without fracture or malalignment. Mild soft tissue prominence of the
wrist. No soft tissue mineralization. No soft tissue gas.
IMPRESSION: 1. No acute osseous abnormality of the right forearm or right wrist.
2. Mild soft tissue prominence of the wrist.
3. Postsurgical changes of trapeziectomy.

## 2021-04-12 ENCOUNTER — Other Ambulatory Visit: Payer: Self-pay

## 2021-04-12 ENCOUNTER — Ambulatory Visit (INDEPENDENT_AMBULATORY_CARE_PROVIDER_SITE_OTHER): Payer: BC Managed Care – PPO | Admitting: Orthopedic Surgery

## 2021-04-12 ENCOUNTER — Ambulatory Visit: Payer: BC Managed Care – PPO

## 2021-04-12 DIAGNOSIS — S92355D Nondisplaced fracture of fifth metatarsal bone, left foot, subsequent encounter for fracture with routine healing: Secondary | ICD-10-CM

## 2021-04-12 NOTE — Progress Notes (Signed)
Chief Complaint  ?Patient presents with  ? Foot Injury  ?  DOI 01/29/21 ?Closed nondisplaced fracture of fifth metatarsal bone of left foot  ? ?Fracture care follow-up closed nondisplaced fracture fifth metatarsal bone left foot patient feeling well x-ray looks good clinical exam normal ? ?Patient may return to normal activity follow-up as needed ?

## 2021-05-02 ENCOUNTER — Other Ambulatory Visit: Payer: Self-pay | Admitting: Internal Medicine

## 2021-05-02 DIAGNOSIS — Z1231 Encounter for screening mammogram for malignant neoplasm of breast: Secondary | ICD-10-CM

## 2021-05-26 ENCOUNTER — Ambulatory Visit: Payer: BC Managed Care – PPO | Admitting: Orthopedic Surgery

## 2021-05-26 ENCOUNTER — Encounter: Payer: Self-pay | Admitting: Orthopedic Surgery

## 2021-05-26 VITALS — Ht 64.0 in | Wt 134.4 lb

## 2021-05-26 DIAGNOSIS — M545 Low back pain, unspecified: Secondary | ICD-10-CM

## 2021-05-26 DIAGNOSIS — M4316 Spondylolisthesis, lumbar region: Secondary | ICD-10-CM | POA: Diagnosis not present

## 2021-05-26 DIAGNOSIS — M79605 Pain in left leg: Secondary | ICD-10-CM

## 2021-05-26 DIAGNOSIS — M5136 Other intervertebral disc degeneration, lumbar region: Secondary | ICD-10-CM

## 2021-05-26 DIAGNOSIS — M4306 Spondylolysis, lumbar region: Secondary | ICD-10-CM

## 2021-05-26 DIAGNOSIS — M51369 Other intervertebral disc degeneration, lumbar region without mention of lumbar back pain or lower extremity pain: Secondary | ICD-10-CM

## 2021-05-26 DIAGNOSIS — M79604 Pain in right leg: Secondary | ICD-10-CM

## 2021-05-26 NOTE — Patient Instructions (Signed)
Physical therapy has been ordered for you at [facility]. They are located on [address] right beside [place] in [place]. They should call you to schedule, [phone] is the phone number to call, if you want to call to schedule.   ? ?Follow up in 5 weeks ?

## 2021-05-26 NOTE — Progress Notes (Signed)
Chief Complaint  ?Patient presents with  ? Back Pain  ?  Back pain, has seen [facility], brought xrays.  Has a known pars defect.  ? ? 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  She has had back pain on and off for years.  She says the pain is increased recently.  It may have started after doing some work around the home but in any event she has sciatica on the left side at least pain radiates to the left knee on the right side she has pain radiating into the right thigh.  She says oxycodone does not help the pain at all but the muscle relaxer seem to help she also takes some Tylenol and ibuprofen ? ? ? ?Past Medical History:  ?Diagnosis Date  ? Breast cancer ([place]) 2000  ? right  ? DCIS (ductal carcinoma in situ) of breast 2000  ? right  ? Diverticulosis   ? History of shingles   ? Hypercholesteremia   ? Hyperlipidemia   ? Iron deficiency   ? Iron deficiency anemia 07/19/2010  ? Necrosis, breast fat   ? Osteopenia   ? Pars flaccida   ? lumbar back  ? PE (pulmonary embolism)   ? Pulled muscle may 2013  ? back  ? Pulmonary embolism ([place]) 07/19/2010  ? Tendonitis of elbow, right   ? History of  ? ? ?Ht '5\' 4"'$  (1.626 m)   Wt 134 lb 6.4 oz (61 kg)   BMI 23.07 kg/m?  ? ? ?General appearance: Well-developed well-nourished no gross deformities ? ?Cardiovascular normal pulse and perfusion normal color without edema ? ?Neurologically no sensation loss or deficits or pathologic reflexes ? ?Psychological: Awake alert and oriented x3 mood and affect normal ? ?Skin no lacerations or ulcerations no nodularity no palpable masses, no erythema or nodularity ? ?Musculoskeletal: She has tenderness in the lower back right side left side and midline ? ?She has no pain with flexion but increased pain with extension.  She has normal strength in both lower extremities.  X-rays from Dr. Tanna Furry office are dated November 2022.  She has grade 2  spondylolisthesis, L5-S1 100% loss of disc height, spondylolysis as well at that level. ? ? ? ?Imaging no new imaging ? ?A/P ? ?Encounter Diagnoses  ?Name Primary?  ? Lumbar pain with radiation down both legs Yes  ? Spondylolysis, lumbar region   ? Spondylolisthesis, lumbar region   ? Narrowing of lumbar intervertebral disc space   ? ? ? ?66 year old female chronic back pain exacerbated with history of spondylolysis degenerative disc disease and spondylolisthesis worsening pain ? ?Recommend physical therapy ? ?Recommend 5-week follow-up if no improvement recommend MRI and then referral should be made as this is probably going to cause her some difficulty in the future ?

## 2021-06-13 ENCOUNTER — Other Ambulatory Visit: Payer: Self-pay | Admitting: Internal Medicine

## 2021-06-14 ENCOUNTER — Other Ambulatory Visit (HOSPITAL_COMMUNITY): Payer: Self-pay | Admitting: Internal Medicine

## 2021-06-14 DIAGNOSIS — M810 Age-related osteoporosis without current pathological fracture: Secondary | ICD-10-CM

## 2021-06-15 ENCOUNTER — Ambulatory Visit (HOSPITAL_COMMUNITY): Payer: BC Managed Care – PPO | Attending: Orthopedic Surgery | Admitting: Physical Therapy

## 2021-06-15 ENCOUNTER — Encounter (HOSPITAL_COMMUNITY): Payer: Self-pay | Admitting: Physical Therapy

## 2021-06-15 DIAGNOSIS — R29898 Other symptoms and signs involving the musculoskeletal system: Secondary | ICD-10-CM

## 2021-06-15 DIAGNOSIS — M79605 Pain in left leg: Secondary | ICD-10-CM | POA: Diagnosis not present

## 2021-06-15 DIAGNOSIS — M810 Age-related osteoporosis without current pathological fracture: Secondary | ICD-10-CM | POA: Diagnosis present

## 2021-06-15 DIAGNOSIS — M545 Low back pain, unspecified: Secondary | ICD-10-CM | POA: Diagnosis not present

## 2021-06-15 DIAGNOSIS — R2689 Other abnormalities of gait and mobility: Secondary | ICD-10-CM | POA: Diagnosis present

## 2021-06-15 DIAGNOSIS — M5459 Other low back pain: Secondary | ICD-10-CM | POA: Diagnosis present

## 2021-06-15 DIAGNOSIS — M79604 Pain in right leg: Secondary | ICD-10-CM | POA: Diagnosis not present

## 2021-06-15 NOTE — Therapy (Signed)
OUTPATIENT PHYSICAL THERAPY THORACOLUMBAR EVALUATION   Patient Name: Kaitlyn Morrison MRN: [id number] DOB:1955/10/25, 66 y.o., female Today's Date: 06/15/2021   PT End of Session - 06/15/21 1048     Visit Number 1    Number of Visits 12    Date for PT Re-Evaluation 07/27/21    Authorization Type BCBS(no visit limit, no auth required)    PT Start Time 1049    PT Stop Time 1130    PT Time Calculation (min) 41 min    Activity Tolerance Patient tolerated treatment well    Behavior During Therapy [facility] for tasks assessed/performed             Past Medical History:  Diagnosis Date   Breast cancer ([place]) 2000   right   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   Diverticulosis    History of shingles    Hypercholesteremia    Hyperlipidemia    Iron deficiency    Iron deficiency anemia 07/19/2010   Necrosis, breast fat    Osteopenia    Pars flaccida    lumbar back   PE (pulmonary embolism)    Pulled muscle may 2013   back   Pulmonary embolism ([place]) 07/19/2010   Tendonitis of elbow, right    History of   Past Surgical History:  Procedure Laterality Date   COLONOSCOPY N/A 11/05/2018   Procedure: COLONOSCOPY;  Surgeon: Rogene Houston, MD;  [facility];  Service: Endoscopy;  Laterality: N/A;  50   EYE SURGERY     LAPAROSCOPIC NISSEN FUNDOPLICATION  5427   MASTECTOMY     right mastectomy with TRAM   MASTECTOMY W/ NODES PARTIAL     had right partial mastectomy with sentinel lymph node biopsy   POLYPECTOMY  11/05/2018   Procedure: POLYPECTOMY;  Surgeon: Rogene Houston, MD;  Location: [facility];  Service: Endoscopy;;  colon   THROAT SURGERY     left vocal cord implants   TRAM     vocal cord surgery     left   Patient Active Problem List   Diagnosis Date Noted   Hx of colonic polyps 08/07/2018   Gastroesophageal reflux disease without esophagitis 01/02/2017   Carpal tunnel syndrome of right wrist 09/19/2016   Ductal carcinoma in  situ of right breast 07/19/2010   Iron deficiency anemia 07/19/2010   Pulmonary embolism ([place]) 07/19/2010   Tendonitis of elbow, right    DERANGEMENT OF POSTERIOR HORN OF MEDIAL MENISCUS 02/27/2010   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   ILIOTIBIAL BAND SYNDROME, LEFT KNEE 02/27/2010    PCP: Allyn Kenner MD  REFERRING PROVIDER: Carole Civil, MD   REFERRING DIAG: M54.50,M79.604,M79.605 (ICD-10-CM) - Lumbar pain with radiation down both legs   THERAPY DIAG:  Other low back pain  Other abnormalities of gait and mobility  Other symptoms and signs involving the musculoskeletal system  ONSET DATE: October/November 2022  SUBJECTIVE:  SUBJECTIVE STATEMENT: Patient states back hurts a lot of the time. She can't do anything she used to be able to do like digging and lifting. Pain is very annoying and debilitating. Has tried going to [employer] which helped for short time.  PERTINENT HISTORY:  Chronic LBP, Pars defect, Hx breast cancer, HLD, osteopenia  PAIN:  Are you having pain? Yes: NPRS scale: 3-4/10 Pain location: low back  Pain description: constant ache Aggravating factors: lifting, digging Relieving factors: meds   PRECAUTIONS: None  WEIGHT BEARING RESTRICTIONS No  FALLS:  Has patient fallen in last 6 months? 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  PLOF: Independent  PATIENT GOALS decrease pain   OBJECTIVE:   PATIENT SURVEYS:  FOTO 51% function  SCREENING FOR RED FLAGS: Bowel or bladder incontinence: No Spinal tumors: No Cauda equina syndrome: No Compression fracture:  No Abdominal aneurysm: No  COGNITION:  Overall cognitive status: Within functional limits for tasks assessed     SENSATION: WFL  POSTURE:  WFL  PALPATION: TTP lower lumbar paraspinals  LUMBAR ROM:   Active  A/PROM  06/15/2021  Flexion 0% limited  Extension 25% limited*  Right lateral flexion 0% limited*  Left lateral flexion 0% limited*  Right rotation 25% limited*  Left rotation 25% limited*   (Blank rows = not tested) *=pain  LE ROM: WFL, lacking slight hip extension bilaterally  Active  Right 06/15/2021 Left 06/15/2021  Hip flexion    Hip extension    Hip abduction    Hip adduction    Hip internal rotation    Hip external rotation    Knee flexion    Knee extension    Ankle dorsiflexion    Ankle plantarflexion    Ankle inversion    Ankle eversion     (Blank rows = not tested)  LE MMT: intermittent back pain with bracing for MMT  MMT Right 06/15/2021 Left 06/15/2021  Hip flexion 4/5 4+/5  Hip extension 4-/5 3+/5  Hip abduction 4-/5 4-/5  Hip adduction    Hip internal rotation    Hip external rotation    Knee flexion 5/5 5/5  Knee extension 5/5 5/5  Ankle dorsiflexion 5/5 5/5  Ankle plantarflexion    Ankle inversion    Ankle eversion     (Blank rows = not tested)    FUNCTIONAL TESTS:  30 seconds chair stand test 12 reps 2 minute walk test: 975 Feet   GAIT: Distance walked: 975 feet Assistive device utilized: None Level of assistance: Complete Independence Comments: 2 MWT    TODAY'S TREATMENT  06/15/21 Ab set 10 x 5 second holds March with ab set 10 x bilateral Bridge 2x 10   PATIENT EDUCATION:  Education details: Patient educated on exam findings, POC, scope of PT, HEP, and beginning walking program. Person educated: Patient Education method: Consulting civil engineer, Media planner, and Handouts Education comprehension: verbalized understanding, returned demonstration, verbal cues required, and tactile cues required   HOME EXERCISE  PROGRAM: 5/18/23Access Code: [email or web address] - Abdominal Bracing  - 2 x daily - 7 x weekly - 2 sets - 10 reps - 5 second hold - Supine March  - 2 x daily - 7 x weekly - 3 sets - 10 reps - Supine Bridge  - 2 x daily - 7 x weekly - 3 sets -  10 reps  ASSESSMENT:  CLINICAL IMPRESSION: Patient a 66 y.o. y.o. female who was seen today for physical therapy evaluation and treatment for LBP. Patient presents with pain limited deficits in lumbar and LE strength, ROM, endurance, activity tolerance, and functional mobility with ADL. Patient is having to modify and restrict ADL as indicated by outcome measure score as well as subjective information and objective measures which is affecting overall participation. Patient will benefit from skilled physical therapy in order to improve function and reduce impairment.    OBJECTIVE IMPAIRMENTS decreased activity tolerance, decreased mobility, difficulty walking, decreased ROM, decreased strength, increased muscle spasms, impaired flexibility, improper body mechanics, postural dysfunction, and pain.   ACTIVITY LIMITATIONS cleaning, community activity, meal prep, occupation, yard work, and shopping.   PERSONAL FACTORS Age, Time since onset of injury/illness/exacerbation, and 3+ comorbidities: chronic LBP/sciatica, pars defect, hx cancer, HLD, osteopenia  are also affecting patient's functional outcome.    REHAB POTENTIAL: Good  CLINICAL DECISION MAKING: Stable/uncomplicated  EVALUATION COMPLEXITY: Low   GOALS: Goals reviewed with patient? Yes  SHORT TERM GOALS: Target date: 07/06/2021  Patient will be independent with HEP in order to improve functional outcomes. Baseline:  Goal status: INITIAL  2.  Patient will report at least 25% improvement in symptoms for improved quality of life. Baseline:  Goal status: INITIAL    LONG TERM GOALS: Target date: 07/27/2021  Patient will report at least 75% improvement in symptoms for improved quality of  life. Baseline:  Goal status: INITIAL  2.  Patient will improve FOTO score by at least 10 points in order to indicate improved tolerance to activity. Baseline: 51% function Goal status: INITIAL  3.  Patient will demonstrate at least 25% improvement in lumbar ROM in all restricted planes for improved ability to move trunk while completing chores. Baseline: see ROM Goal status: INITIAL  4.  Patient will demonstrate grade of 5/5 MMT grade in all tested musculature as evidence of improved strength to assist with stair ambulation and gait.   Baseline: see MMT Goal status: INITIAL  5.  Patient will be able to return to all activities unrestricted for improved ability to perform work functions and participate with friends/family. Baseline:  Goal status: INITIAL   PLAN: PT FREQUENCY: 2x/week  PT DURATION: 6 weeks  PLANNED INTERVENTIONS: Therapeutic exercises, Therapeutic activity, Neuromuscular re-education, Balance training, Gait training, Patient/Family education, Joint manipulation, Joint mobilization, Stair training, Orthotic/Fit training, DME instructions, Aquatic Therapy, Dry Needling, Electrical stimulation, Spinal manipulation, Spinal mobilization, Cryotherapy, Moist heat, Compression bandaging, scar mobilization, Splintting, Taping, Traction, Ultrasound, Ionotophoresis '4mg'$ /ml Dexamethasone, and Manual therapy   PLAN FOR NEXT SESSION: f/u with HEP and walking program, core and hip strength, progress as toelrated   Mearl Latin, PT 06/15/2021, 11:35 AM

## 2021-06-20 ENCOUNTER — Ambulatory Visit (HOSPITAL_COMMUNITY): Payer: BC Managed Care – PPO

## 2021-06-20 ENCOUNTER — Ambulatory Visit (HOSPITAL_COMMUNITY)
Admission: RE | Admit: 2021-06-20 | Discharge: 2021-06-20 | Disposition: A | Discharge status: Home / Self Care | Payer: BC Managed Care – PPO | Source: Ambulatory Visit | Attending: Internal Medicine | Admitting: Internal Medicine

## 2021-06-20 ENCOUNTER — Encounter (HOSPITAL_COMMUNITY): Payer: Self-pay

## 2021-06-20 DIAGNOSIS — M5459 Other low back pain: Secondary | ICD-10-CM | POA: Diagnosis not present

## 2021-06-20 DIAGNOSIS — M810 Age-related osteoporosis without current pathological fracture: Secondary | ICD-10-CM | POA: Insufficient documentation

## 2021-06-20 DIAGNOSIS — R29898 Other symptoms and signs involving the musculoskeletal system: Secondary | ICD-10-CM

## 2021-06-20 DIAGNOSIS — R2689 Other abnormalities of gait and mobility: Secondary | ICD-10-CM

## 2021-06-20 NOTE — Therapy (Signed)
OUTPATIENT PHYSICAL THERAPY THORACOLUMBAR TREATMENT   Patient Name: Kaitlyn Morrison MRN: [id number] DOB:July 24, 1955, 66 y.o., female Today's Date: 06/20/2021   PT End of Session - 06/20/21 0814     Visit Number 2    Number of Visits 12    Date for PT Re-Evaluation 07/27/21    Authorization Type BCBS(no visit limit, no auth required)    PT Start Time 0815    PT Stop Time 0900    PT Time Calculation (min) 45 min    Activity Tolerance Patient tolerated treatment well    Behavior During Therapy [facility] for tasks assessed/performed             Past Medical History:  Diagnosis Date   Breast cancer ([place]) 2000   right   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   Diverticulosis    History of shingles    Hypercholesteremia    Hyperlipidemia    Iron deficiency    Iron deficiency anemia 07/19/2010   Necrosis, breast fat    Osteopenia    Pars flaccida    lumbar back   PE (pulmonary embolism)    Pulled muscle may 2013   back   Pulmonary embolism ([place]) 07/19/2010   Tendonitis of elbow, right    History of   Past Surgical History:  Procedure Laterality Date   COLONOSCOPY N/A 11/05/2018   Procedure: COLONOSCOPY;  Surgeon: Rogene Houston, MD;  [facility];  Service: Endoscopy;  Laterality: N/A;  52   EYE SURGERY     LAPAROSCOPIC NISSEN FUNDOPLICATION  3419   MASTECTOMY     right mastectomy with TRAM   MASTECTOMY W/ NODES PARTIAL     had right partial mastectomy with sentinel lymph node biopsy   POLYPECTOMY  11/05/2018   Procedure: POLYPECTOMY;  Surgeon: Rogene Houston, MD;  [facility];  Service: Endoscopy;;  colon   THROAT SURGERY     left vocal cord implants   TRAM     vocal cord surgery     left   Patient Active Problem List   Diagnosis Date Noted   Hx of colonic polyps 08/07/2018   Gastroesophageal reflux disease without esophagitis 01/02/2017   Carpal tunnel syndrome of right wrist 09/19/2016   Ductal carcinoma in situ  of right breast 07/19/2010   Iron deficiency anemia 07/19/2010   Pulmonary embolism ([place]) 07/19/2010   Tendonitis of elbow, right    DERANGEMENT OF POSTERIOR HORN OF MEDIAL MENISCUS 02/27/2010   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   ILIOTIBIAL BAND SYNDROME, LEFT KNEE 02/27/2010    PCP: Allyn Kenner MD  REFERRING PROVIDER: Carole Civil, MD   REFERRING DIAG: M54.50,M79.604,M79.605 (ICD-10-CM) - Lumbar pain with radiation down both legs   THERAPY DIAG:  Other low back pain  Other abnormalities of gait and mobility  Other symptoms and signs involving the musculoskeletal system  ONSET DATE: October/November 2022  SUBJECTIVE:  SUBJECTIVE STATEMENT: Patient reports doing well today, in no pain currently. Reports yesterday was last time had pain. Pt reports gets pain after standing for more than 2-3 minutes standing. Back pain dissipates after sitting for 2-3 mins. Pain in area of SI joint   PERTINENT HISTORY:  Chronic LBP, Pars defect, Hx breast cancer, HLD, osteopenia  PAIN:  Are you having pain? Yes: NPRS scale: 0/10 Pain location: low back  Pain description: constant ache Aggravating factors: lifting, digging Relieving factors: meds   PRECAUTIONS: None  WEIGHT BEARING RESTRICTIONS No  FALLS:  Has patient fallen in last 6 months? 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  PLOF: Independent  PATIENT GOALS decrease pain   OBJECTIVE:   PATIENT SURVEYS:  FOTO 51% function  SCREENING FOR RED FLAGS: Bowel or bladder incontinence: No Spinal tumors: No Cauda equina syndrome:  No Compression fracture: No Abdominal aneurysm: No  COGNITION:  Overall cognitive status: Within functional limits for tasks assessed     SENSATION: WFL  POSTURE:  WFL  PALPATION: TTP lower lumbar paraspinals  LUMBAR ROM:   Active  A/PROM  06/15/2021  Flexion 0% limited  Extension 25% limited*  Right lateral flexion 0% limited*  Left lateral flexion 0% limited*  Right rotation 25% limited*  Left rotation 25% limited*   (Blank rows = not tested) *=pain  LE ROM: WFL, lacking slight hip extension bilaterally  Active  Right 06/15/2021 Left 06/15/2021  Hip flexion    Hip extension    Hip abduction    Hip adduction    Hip internal rotation    Hip external rotation    Knee flexion    Knee extension    Ankle dorsiflexion    Ankle plantarflexion    Ankle inversion    Ankle eversion     (Blank rows = not tested)  LE MMT: intermittent back pain with bracing for MMT  MMT Right 06/15/2021 Left 06/15/2021  Hip flexion 4/5 4+/5  Hip extension 4-/5 3+/5  Hip abduction 4-/5 4-/5  Hip adduction    Hip internal rotation    Hip external rotation    Knee flexion 5/5 5/5  Knee extension 5/5 5/5  Ankle dorsiflexion 5/5 5/5  Ankle plantarflexion    Ankle inversion    Ankle eversion     (Blank rows = not tested)    FUNCTIONAL TESTS:  30 seconds chair stand test 12 reps 2 minute walk test: 975 Feet   GAIT: Distance walked: 975 feet Assistive device utilized: None Level of assistance: Complete Independence Comments: 2 MWT    TODAY'S TREATMENT   06/20/21 - Figure 4 Bridge  - Bilateral Bent Leg Lift   - Dead Bug   - Hooklying Isometric Hip Abduction Adduction with Belt and Ball  - Quadruped Cat Cow   - Supine Lower Trunk Rotation  - Seated Table Hamstring Stretch   - Seated Piriformis Stretch with Trunk Bend  - cable walkouts 2 plates retro x5 -APT, PPT in standing   06/15/21 Ab set 10 x 5 second holds March with ab set 10 x bilateral Bridge 2x  10   PATIENT EDUCATION:  Education details: updated HEP. Person educated: Patient Education method: Explanation, Demonstration, and Handouts Education comprehension: verbalized understanding, returned demonstration, verbal cues required, and tactile cues required   HOME  EXERCISE PROGRAM:  Access Code: [email or web address] URL: [email or web address] Date: 06/20/2021 Prepared by: Leota Jacobsen  Exercises - Figure 4 Bridge  - 1 x daily - 7 x weekly - 3 sets - 10 reps - Bilateral Bent Leg Lift  - 1 x daily - 7 x weekly - 3 sets - 10 reps - Dead Bug  - 1 x daily - 7 x weekly - 3 sets - 10 reps - 3 hold - Hooklying Isometric Hip Abduction Adduction with Belt and Ball  - 1 x daily - 7 x weekly - 3 sets - 10 reps - 3 hold - Quadruped Cat Cow  - 1 x daily - 7 x weekly - 1 sets - 7 reps - Supine Lower Trunk Rotation  - 1 x daily - 7 x weekly - 3 sets - 10 reps - 3 hold - Seated Table Hamstring Stretch  - 2 x daily - 7 x weekly - 1 sets - 3 reps - 15 hold - Seated Piriformis Stretch with Trunk Bend  - 1 x daily - 7 x weekly - 1 sets - 3 reps - 15 hold   5/18/23Access Code: [email or web address] - Abdominal Bracing  - 2 x daily - 7 x weekly - 2 sets - 10 reps - 5 second hold - Supine March  - 2 x daily - 7 x weekly - 3 sets - 10 reps - Supine Bridge  - 2 x daily - 7 x weekly - 3 sets - 10 reps  ASSESSMENT:  CLINICAL IMPRESSION: Impression from today is that patient may have a rotation of at left SI joint which is causing pain. Patient tolerates session well. Pt taught PPT to try when having pain and standing for longer periods of time. Initially patient reports that this helps alleviate some pain. Patient will continue to benefit from skilled PT services to improve strength, stability and decrease pain.    OBJECTIVE IMPAIRMENTS decreased activity tolerance, decreased mobility, difficulty walking, decreased ROM, decreased strength, increased muscle spasms, impaired flexibility, improper body  mechanics, postural dysfunction, and pain.   ACTIVITY LIMITATIONS cleaning, community activity, meal prep, occupation, yard work, and shopping.   PERSONAL FACTORS Age, Time since onset of injury/illness/exacerbation, and 3+ comorbidities: chronic LBP/sciatica, pars defect, hx cancer, HLD, osteopenia  are also affecting patient's functional outcome.    REHAB POTENTIAL: Good  CLINICAL DECISION MAKING: Stable/uncomplicated  EVALUATION COMPLEXITY: Low   GOALS: Goals reviewed with patient? Yes  SHORT TERM GOALS: Target date: 07/06/2021  Patient will be independent with HEP in order to improve functional outcomes. Baseline:  Goal status: INITIAL  2.  Patient will report at least 25% improvement in symptoms for improved quality of life. Baseline:  Goal status: INITIAL    LONG TERM GOALS: Target date: 07/27/2021  Patient will report at least 75% improvement in symptoms for improved quality of life. Baseline:  Goal status: INITIAL  2.  Patient will improve FOTO score by at least 10 points in order to indicate improved tolerance to activity. Baseline: 51% function Goal status: INITIAL  3.  Patient will demonstrate at least 25% improvement in lumbar ROM in all restricted planes for improved ability to move trunk while completing chores. Baseline: see ROM Goal status: INITIAL  4.  Patient will demonstrate grade of 5/5 MMT grade in all tested musculature as evidence of improved strength to assist with stair ambulation and gait.   Baseline: see MMT Goal status: INITIAL  5.  Patient will be able to return to all  activities unrestricted for improved ability to perform work functions and participate with friends/family. Baseline:  Goal status: INITIAL   PLAN: PT FREQUENCY: 2x/week  PT DURATION: 6 weeks  PLANNED INTERVENTIONS: Therapeutic exercises, Therapeutic activity, Neuromuscular re-education, Balance training, Gait training, Patient/Family education, Joint manipulation,  Joint mobilization, Stair training, Orthotic/Fit training, DME instructions, Aquatic Therapy, Dry Needling, Electrical stimulation, Spinal manipulation, Spinal mobilization, Cryotherapy, Moist heat, Compression bandaging, scar mobilization, Splintting, Taping, Traction, Ultrasound, Ionotophoresis '4mg'$ /ml Dexamethasone, and Manual therapy   PLAN FOR NEXT SESSION: f/u with HEP and walking program, core and hip strength, progress as toelrated   Avonelle Viveros, PT 06/20/2021, 12:33 PM

## 2021-06-22 ENCOUNTER — Encounter (HOSPITAL_COMMUNITY): Payer: Self-pay

## 2021-06-22 ENCOUNTER — Ambulatory Visit (HOSPITAL_COMMUNITY): Payer: BC Managed Care – PPO

## 2021-06-22 DIAGNOSIS — R2689 Other abnormalities of gait and mobility: Secondary | ICD-10-CM

## 2021-06-22 DIAGNOSIS — R29898 Other symptoms and signs involving the musculoskeletal system: Secondary | ICD-10-CM

## 2021-06-22 DIAGNOSIS — M5459 Other low back pain: Secondary | ICD-10-CM | POA: Diagnosis not present

## 2021-06-22 NOTE — Therapy (Addendum)
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   Diverticulosis    History of shingles    Hypercholesteremia    Hyperlipidemia    Iron deficiency    Iron deficiency anemia 07/19/2010   Necrosis, breast fat    Osteopenia    Pars flaccida    lumbar back   PE (pulmonary embolism)    Pulled muscle may 2013   back   Pulmonary embolism ([place]) 07/19/2010   Tendonitis of elbow, right    History of   Past Surgical History:  Procedure Laterality Date   COLONOSCOPY N/A 11/05/2018   Procedure: COLONOSCOPY;  Surgeon: Rogene Houston, MD;  [facility];  Service: Endoscopy;  Laterality: N/A;  23   EYE SURGERY     LAPAROSCOPIC NISSEN FUNDOPLICATION  8719   MASTECTOMY     right mastectomy with TRAM   MASTECTOMY W/ NODES PARTIAL     had right partial mastectomy with sentinel lymph node biopsy   POLYPECTOMY  11/05/2018   Procedure: POLYPECTOMY;  Surgeon: Rogene Houston, MD;  [facility]: [facility];  Service: Endoscopy;;  colon   THROAT SURGERY     left vocal cord implants   TRAM     vocal cord surgery     left   Patient Active Problem List   Diagnosis Date Noted   Hx of colonic polyps 08/07/2018   Gastroesophageal reflux disease without esophagitis 01/02/2017   Carpal tunnel syndrome of right wrist 09/19/2016   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   Iron deficiency anemia 07/19/2010   Pulmonary  embolism ([place]) 07/19/2010   Tendonitis of elbow, right    DERANGEMENT OF POSTERIOR HORN OF MEDIAL MENISCUS 02/27/2010   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   ILIOTIBIAL BAND SYNDROME, LEFT KNEE 02/27/2010    PCP: Allyn Kenner MD  REFERRING PROVIDER: Carole Civil, MD   REFERRING DIAG: M54.50,M79.604,M79.605 (ICD-10-CM) - Lumbar pain with radiation down both legs   THERAPY DIAG:  Other low back pain  Other abnormalities of gait and mobility  Other symptoms and signs involving the musculoskeletal system  ONSET DATE: October/November 2022  SUBJECTIVE:  SUBJECTIVE STATEMENT: Pt stated she has constant nagging pain on Lt SI joint, pointing towards.  Reports increased pain with slow walking  PERTINENT HISTORY:  Chronic LBP, Pars defect, Hx breast cancer, HLD, osteopenia  PAIN:  Are you having pain? Yes: NPRS scale: 0/10 Pain location: low back  Pain description: nagging Aggravating factors: lifting, digging Relieving factors: meds   PRECAUTIONS: None  WEIGHT BEARING RESTRICTIONS No  FALLS:  Has patient fallen in last 6 months? 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  PLOF: Independent  PATIENT GOALS decrease pain   OBJECTIVE:   PATIENT SURVEYS:  FOTO 51% function  SCREENING FOR RED FLAGS: Bowel or bladder incontinence: No Spinal tumors: No Cauda equina syndrome: No Compression fracture: No Abdominal aneurysm: No  COGNITION:  Overall cognitive status: Within functional limits for tasks assessed     SENSATION: WFL  POSTURE:  WFL  PALPATION: TTP lower lumbar  paraspinals  LUMBAR ROM:   Active  A/PROM  06/15/2021  Flexion 0% limited  Extension 25% limited*  Right lateral flexion 0% limited*  Left lateral flexion 0% limited*  Right rotation 25% limited*  Left rotation 25% limited*   (Blank rows = not tested) *=pain  LE ROM: WFL, lacking slight hip extension bilaterally  Active  Right 06/15/2021 Left 06/15/2021  Hip flexion    Hip extension    Hip abduction    Hip adduction    Hip internal rotation    Hip external rotation    Knee flexion    Knee extension    Ankle dorsiflexion    Ankle plantarflexion    Ankle inversion    Ankle eversion     (Blank rows = not tested)  LE MMT: intermittent back pain with bracing for MMT  MMT Right 06/15/2021 Left 06/15/2021  Hip flexion 4/5 4+/5  Hip extension 4-/5 3+/5  Hip abduction 4-/5 4-/5  Hip adduction    Hip internal rotation    Hip external rotation    Knee flexion 5/5 5/5  Knee extension 5/5 5/5  Ankle dorsiflexion 5/5 5/5  Ankle plantarflexion    Ankle inversion    Ankle eversion     (Blank rows = not tested)    FUNCTIONAL TESTS:  30 seconds chair stand test 12 reps 2 minute walk test: 975 Feet   GAIT: Distance walked: 975 feet Assistive device utilized: None Level of assistance: Complete Independence Comments: 2 MWT    TODAY'S TREATMENT  06/22/21: MET for Lt SI outflare Bridge Lt close SLR Rt only Pubic clearing isometric hip adduction with ball and abduction with belt Posterior pelvic tilt Bent floor raise with ab set 2MWT with ab set, different speed able to walk slow without increased pain.  Attempted quadruped UE- increased thumb pain so stopped   06/20/21 - Figure 4 Bridge  - Bilateral Bent Leg Lift   - Dead Bug   - Hooklying Isometric Hip Abduction Adduction with Belt and Ball  - Quadruped Cat Cow   - Supine Lower Trunk Rotation  - Seated Table Hamstring Stretch   - Seated Piriformis Stretch with Trunk Bend  - cable walkouts 2 plates retro  x5 -APT, PPT in standing   06/15/21 Ab set 10 x 5 second holds March with ab set 10 x bilateral Bridge 2x 10   PATIENT  EDUCATION:  Education details: updated HEP. Person educated: Patient Education method: Explanation, Demonstration, and Handouts Education comprehension: verbalized understanding, returned demonstration, verbal cues required, and tactile cues required   HOME EXERCISE PROGRAM:  Access Code: [email or web address] URL: [email or web address] Date: 06/20/2021 Prepared by: Leota Jacobsen  Exercises - Figure 4 Bridge  - 1 x daily - 7 x weekly - 3 sets - 10 reps - Bilateral Bent Leg Lift  - 1 x daily - 7 x weekly - 3 sets - 10 reps - Dead Bug  - 1 x daily - 7 x weekly - 3 sets - 10 reps - 3 hold - Hooklying Isometric Hip Abduction Adduction with Belt and Ball  - 1 x daily - 7 x weekly - 3 sets - 10 reps - 3 hold - Quadruped Cat Cow  - 1 x daily - 7 x weekly - 1 sets - 7 reps - Supine Lower Trunk Rotation  - 1 x daily - 7 x weekly - 3 sets - 10 reps - 3 hold - Seated Table Hamstring Stretch  - 2 x daily - 7 x weekly - 1 sets - 3 reps - 15 hold - Seated Piriformis Stretch with Trunk Bend  - 1 x daily - 7 x weekly - 1 sets - 3 reps - 15 hold   5/18/23Access Code: [email or web address] - Abdominal Bracing  - 2 x daily - 7 x weekly - 2 sets - 10 reps - 5 second hold - Supine March  - 2 x daily - 7 x weekly - 3 sets - 10 reps - Supine Bridge  - 2 x daily - 7 x weekly - 3 sets - 10 reps  ASSESSMENT:  CLINICAL IMPRESSION: Pain with palpation over Lt PSIS, noted rotation of SI joint.  MET complete to improve alignment and core strengthening exercises complete following to assure good alignment.  Pt able to ambulate at slow and fast cadence with no change in LBP at EOS.    OBJECTIVE IMPAIRMENTS decreased activity tolerance, decreased mobility, difficulty walking, decreased ROM, decreased strength, increased muscle spasms, impaired flexibility, improper body mechanics, postural  dysfunction, and pain.   ACTIVITY LIMITATIONS cleaning, community activity, meal prep, occupation, yard work, and shopping.   PERSONAL FACTORS Age, Time since onset of injury/illness/exacerbation, and 3+ comorbidities: chronic LBP/sciatica, pars defect, hx cancer, HLD, osteopenia  are also affecting patient's functional outcome.    REHAB POTENTIAL: Good  CLINICAL DECISION MAKING: Stable/uncomplicated  EVALUATION COMPLEXITY: Low   GOALS: Goals reviewed with patient? Yes  SHORT TERM GOALS: Target date: 07/06/2021  Patient will be independent with HEP in order to improve functional outcomes. Baseline:  Goal status: Ongoing  2.  Patient will report at least 25% improvement in symptoms for improved quality of life. Baseline:  Goal status:  Ongoing    LONG TERM GOALS: Target date: 07/27/2021  Patient will report at least 75% improvement in symptoms for improved quality of life. Baseline:  Goal status: Ongoing  2.  Patient will improve FOTO score by at least 10 points in order to indicate improved tolerance to activity. Baseline: 51% function Goal status:  Ongoing  3.  Patient will demonstrate at least 25% improvement in lumbar ROM in all restricted planes for improved ability to move trunk while completing chores. Baseline: see ROM Goal status: Ongoing  4.  Patient will demonstrate grade of 5/5 MMT grade in all tested musculature as evidence of improved strength to assist with stair ambulation and gait.   Baseline: see MMT  Goal status:  Ongoing  5.  Patient will be able to return to all activities unrestricted for improved ability to perform work functions and participate with friends/family. Baseline:  Goal status: Ongoing   PLAN: PT FREQUENCY: 2x/week  PT DURATION: 6 weeks  PLANNED INTERVENTIONS: Therapeutic exercises, Therapeutic activity, Neuromuscular re-education, Balance training, Gait training, Patient/Family education, Joint manipulation, Joint mobilization,  Stair training, Orthotic/Fit training, DME instructions, Aquatic Therapy, Dry Needling, Electrical stimulation, Spinal manipulation, Spinal mobilization, Cryotherapy, Moist heat, Compression bandaging, scar mobilization, Splintting, Taping, Traction, Ultrasound, Ionotophoresis 62m/ml Dexamethasone, and Manual therapy   PLAN FOR NEXT SESSION: f/u with HEP and walking program, core and hip strength, progress as toelrated  CIhor Austin LPTA/CLT; CBIS [phone] CAldona Lento PTA 06/29/2021, 8:33 AM

## 2021-06-29 ENCOUNTER — Encounter (HOSPITAL_COMMUNITY): Payer: Self-pay

## 2021-06-29 ENCOUNTER — Ambulatory Visit (HOSPITAL_COMMUNITY): Payer: BC Managed Care – PPO | Attending: Orthopedic Surgery

## 2021-06-29 DIAGNOSIS — M5459 Other low back pain: Secondary | ICD-10-CM | POA: Insufficient documentation

## 2021-06-29 DIAGNOSIS — R29898 Other symptoms and signs involving the musculoskeletal system: Secondary | ICD-10-CM | POA: Diagnosis present

## 2021-06-29 DIAGNOSIS — R2689 Other abnormalities of gait and mobility: Secondary | ICD-10-CM | POA: Diagnosis present

## 2021-06-29 NOTE — Therapy (Signed)
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   Diverticulosis    History of shingles    Hypercholesteremia    Hyperlipidemia    Iron deficiency    Iron deficiency anemia 07/19/2010   Necrosis, breast fat    Osteopenia    Pars flaccida    lumbar back   PE (pulmonary embolism)    Pulled muscle may 2013   back   Pulmonary embolism ([place]) 07/19/2010   Tendonitis of elbow, right    History of   Past Surgical History:  Procedure Laterality Date   COLONOSCOPY N/A 11/05/2018   Procedure: COLONOSCOPY;  Surgeon: Rogene Houston, MD;  [facility];  Service: Endoscopy;  Laterality: N/A;  31   EYE SURGERY     LAPAROSCOPIC NISSEN FUNDOPLICATION  0867   MASTECTOMY     right mastectomy with TRAM   MASTECTOMY W/ NODES PARTIAL     had right partial mastectomy with sentinel lymph node biopsy   POLYPECTOMY  11/05/2018   Procedure: POLYPECTOMY;  Surgeon: Rogene Houston, MD;  [facility];  Service: Endoscopy;;  colon   THROAT SURGERY     left vocal cord implants   TRAM     vocal cord surgery     left   Patient Active Problem List   Diagnosis Date Noted   Hx of colonic polyps 08/07/2018   Gastroesophageal reflux disease without esophagitis 01/02/2017   Carpal tunnel syndrome of right wrist 09/19/2016   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  ONSET DATE: October/November 2022  SUBJECTIVE:  SUBJECTIVE STATEMENT: Pt reports relief for 4 days following last session.  Current pain scale 4/10 nagging pain on Lt SI joint.  Continues to walk, has pain walking slow.  Reports she went to beach and able to walk without pain.   PERTINENT HISTORY:  Chronic LBP, Pars defect, Hx breast cancer, HLD, osteopenia  PAIN:  Are you having pain? Yes: NPRS scale: 4/10 Pain location: low back  Pain description: nagging Aggravating factors: lifting, digging Relieving factors: meds   PRECAUTIONS: None  WEIGHT BEARING RESTRICTIONS No  FALLS:  Has patient fallen in last 6 months? 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  PLOF: Independent  PATIENT GOALS decrease pain   OBJECTIVE:   PATIENT SURVEYS:  FOTO 51% function  SCREENING FOR RED FLAGS: Bowel or bladder incontinence: No Spinal tumors: No Cauda equina syndrome: No Compression fracture: No Abdominal aneurysm: No  COGNITION:  Overall cognitive status: Within functional limits for tasks assessed     SENSATION: WFL  POSTURE:  WFL  PALPATION: TTP lower lumbar paraspinals  LUMBAR  ROM:   Active  A/PROM  06/15/2021  Flexion 0% limited  Extension 25% limited*  Right lateral flexion 0% limited*  Left lateral flexion 0% limited*  Right rotation 25% limited*  Left rotation 25% limited*   (Blank rows = not tested) *=pain  LE ROM: WFL, lacking slight hip extension bilaterally  Active  Right 06/15/2021 Left 06/15/2021  Hip flexion    Hip extension    Hip abduction    Hip adduction    Hip internal rotation    Hip external rotation    Knee flexion    Knee extension    Ankle dorsiflexion    Ankle plantarflexion    Ankle inversion    Ankle eversion     (Blank rows = not tested)  LE MMT: intermittent back pain with bracing for MMT  MMT Right 06/15/2021 Left 06/15/2021  Hip flexion 4/5 4+/5  Hip extension 4-/5 3+/5  Hip abduction 4-/5 4-/5  Hip adduction    Hip internal rotation    Hip external rotation    Knee flexion 5/5 5/5  Knee extension 5/5 5/5  Ankle dorsiflexion 5/5 5/5  Ankle plantarflexion    Ankle inversion    Ankle eversion     (Blank rows = not tested)    FUNCTIONAL TESTS:  30 seconds chair stand test 12 reps 2 minute walk test: 975 Feet   GAIT: Distance walked: 975 feet Assistive device utilized: None Level of assistance: Complete Independence Comments: 2 MWT    TODAY'S TREATMENT  06/29/21: Seated: anterior and posterior pelvic tilt MET for Lt SI outflare Bridge Lt close SLR Rt only 2MWT 972f with ab set, able to slow down without pain Supine: clam with RTB with ab set 10x 5" each  Dead Bug   506/09/23 MET for Lt SI outflare Bridge Lt close SLR Rt only Pubic clearing isometric hip adduction with ball and abduction with belt Posterior pelvic tilt Bent floor raise with ab set 2MWT with ab set, different speed able to walk slow without increased pain.  Attempted quadruped UE- increased thumb pain so stopped   06/20/21 - Figure 4 Bridge  - Bilateral Bent Leg Lift   - Dead Bug   - Hooklying Isometric Hip Abduction  Adduction with  Belt and Ball  - Quadruped Cat Cow   - Supine Lower Trunk Rotation  - Seated Table Hamstring Stretch   - Seated Piriformis Stretch with Trunk Bend  - cable walkouts 2 plates retro x5 -APT, PPT in standing   06/15/21 Ab set 10 x 5 second holds March with ab set 10 x bilateral Bridge 2x 10   PATIENT EDUCATION:  Education details: updated HEP. Person educated: Patient Education method: Explanation, Demonstration, and Handouts Education comprehension: verbalized understanding, returned demonstration, verbal cues required, and tactile cues required   HOME EXERCISE PROGRAM:  Access Code: [email or web address] URL: [email or web address] Date: 06/20/2021 Prepared by: Leota Jacobsen  Exercises - Figure 4 Bridge  - 1 x daily - 7 x weekly - 3 sets - 10 reps - Bilateral Bent Leg Lift  - 1 x daily - 7 x weekly - 3 sets - 10 reps - Dead Bug  - 1 x daily - 7 x weekly - 3 sets - 10 reps - 3 hold - Hooklying Isometric Hip Abduction Adduction with Belt and Ball  - 1 x daily - 7 x weekly - 3 sets - 10 reps - 3 hold - Quadruped Cat Cow  - 1 x daily - 7 x weekly - 1 sets - 7 reps - Supine Lower Trunk Rotation  - 1 x daily - 7 x weekly - 3 sets - 10 reps - 3 hold - Seated Table Hamstring Stretch  - 2 x daily - 7 x weekly - 1 sets - 3 reps - 15 hold - Seated Piriformis Stretch with Trunk Bend  - 1 x daily - 7 x weekly - 1 sets - 3 reps - 15 hold   5/18/23Access Code: [email or web address] - Abdominal Bracing  - 2 x daily - 7 x weekly - 2 sets - 10 reps - 5 second hold - Supine March  - 2 x daily - 7 x weekly - 3 sets - 10 reps - Supine Bridge  - 2 x daily - 7 x weekly - 3 sets - 10 reps  06/29/21: clam with RTB  ASSESSMENT:  CLINICAL IMPRESSION: Began session with MET for Lt side outflare, reports pain resolved following MET.  Pt able to ambulate at slower cadence with no reports of pain.  Pt able to complete anterior and posterior pelvic tilts with no reoprts of pain.  Therex focus on  lumbar stability.  Added supine clams with some cueing for stability.    OBJECTIVE IMPAIRMENTS decreased activity tolerance, decreased mobility, difficulty walking, decreased ROM, decreased strength, increased muscle spasms, impaired flexibility, improper body mechanics, postural dysfunction, and pain.   ACTIVITY LIMITATIONS cleaning, community activity, meal prep, occupation, yard work, and shopping.   PERSONAL FACTORS Age, Time since onset of injury/illness/exacerbation, and 3+ comorbidities: chronic LBP/sciatica, pars defect, hx cancer, HLD, osteopenia  are also affecting patient's functional outcome.    REHAB POTENTIAL: Good  CLINICAL DECISION MAKING: Stable/uncomplicated  EVALUATION COMPLEXITY: Low   GOALS: Goals reviewed with patient? Yes  SHORT TERM GOALS: Target date: 07/06/2021  Patient will be independent with HEP in order to improve functional outcomes. Baseline:  Goal status: Ongoing  2.  Patient will report at least 25% improvement in symptoms for improved quality of life. Baseline:  Goal status:  Ongoing    LONG TERM GOALS: Target date: 07/27/2021  Patient will report at least 75% improvement in symptoms for improved quality of life. Baseline:  Goal status: Ongoing  2.  Patient will improve FOTO score by at  least 10 points in order to indicate improved tolerance to activity. Baseline: 51% function Goal status:  Ongoing  3.  Patient will demonstrate at least 25% improvement in lumbar ROM in all restricted planes for improved ability to move trunk while completing chores. Baseline: see ROM Goal status: Ongoing  4.  Patient will demonstrate grade of 5/5 MMT grade in all tested musculature as evidence of improved strength to assist with stair ambulation and gait.   Baseline: see MMT Goal status:  Ongoing  5.  Patient will be able to return to all activities unrestricted for improved ability to perform work functions and participate with  friends/family. Baseline:  Goal status: Ongoing   PLAN: PT FREQUENCY: 2x/week  PT DURATION: 6 weeks  PLANNED INTERVENTIONS: Therapeutic exercises, Therapeutic activity, Neuromuscular re-education, Balance training, Gait training, Patient/Family education, Joint manipulation, Joint mobilization, Stair training, Orthotic/Fit training, DME instructions, Aquatic Therapy, Dry Needling, Electrical stimulation, Spinal manipulation, Spinal mobilization, Cryotherapy, Moist heat, Compression bandaging, scar mobilization, Splintting, Taping, Traction, Ultrasound, Ionotophoresis 42m/ml Dexamethasone, and Manual therapy   PLAN FOR NEXT SESSION: f/u with HEP and walking program, core and hip strength, progress as toelrated  CIhor Austin LPTA/CLT; CBIS [phone] CAldona Lento PTA 06/29/2021, 5:45 PM

## 2021-06-30 ENCOUNTER — Ambulatory Visit: Payer: BC Managed Care – PPO | Admitting: Orthopedic Surgery

## 2021-07-03 ENCOUNTER — Ambulatory Visit (HOSPITAL_COMMUNITY): Payer: BC Managed Care – PPO

## 2021-07-03 ENCOUNTER — Ambulatory Visit: Payer: BC Managed Care – PPO | Admitting: Orthopedic Surgery

## 2021-07-03 ENCOUNTER — Encounter (HOSPITAL_COMMUNITY): Payer: Self-pay

## 2021-07-03 DIAGNOSIS — R2689 Other abnormalities of gait and mobility: Secondary | ICD-10-CM

## 2021-07-03 DIAGNOSIS — M5459 Other low back pain: Secondary | ICD-10-CM | POA: Diagnosis not present

## 2021-07-03 DIAGNOSIS — M545 Low back pain, unspecified: Secondary | ICD-10-CM

## 2021-07-03 DIAGNOSIS — M79605 Pain in left leg: Secondary | ICD-10-CM

## 2021-07-03 DIAGNOSIS — M4316 Spondylolisthesis, lumbar region: Secondary | ICD-10-CM | POA: Diagnosis not present

## 2021-07-03 DIAGNOSIS — M79604 Pain in right leg: Secondary | ICD-10-CM

## 2021-07-03 DIAGNOSIS — M4306 Spondylolysis, lumbar region: Secondary | ICD-10-CM | POA: Diagnosis not present

## 2021-07-03 DIAGNOSIS — R29898 Other symptoms and signs involving the musculoskeletal system: Secondary | ICD-10-CM

## 2021-07-03 NOTE — Therapy (Signed)
OUTPATIENT PHYSICAL THERAPY THORACOLUMBAR TREATMENT   Patient Name: Kaitlyn Morrison MRN: [id number] DOB:March 08, 1955, 66 y.o., female Today's Date: 07/03/2021  END OF SESSION:   PT End of Session - 07/03/21 0738     Visit Number 5    Number of Visits 12    Date for PT Re-Evaluation 07/27/21    Authorization Type BCBS(no visit limit, no auth required)    PT Start Time 0737    PT Stop Time 0815    PT Time Calculation (min) 38 min    Activity Tolerance Patient tolerated treatment well    Behavior During Therapy [facility] for tasks assessed/performed            Visit Number: 3 Number of visits: 12 Re-eval date: 07/27/21 Authorization time: BCBS (no visit limit, no auth required) Start time: 1622 Stop time: 1700 Time calculation: 38 Activitiy tolerance:  Patient tolerated treatment well Behavior during treatment: [facility] for tasks assessed/performed  PT End of Session - 07/03/21 0738     Visit Number 5    Number of Visits 12    Date for PT Re-Evaluation 07/27/21    Authorization Type BCBS(no visit limit, no auth required)    PT Start Time 0737    PT Stop Time 0815    PT Time Calculation (min) 38 min    Activity Tolerance Patient tolerated treatment well    Behavior During Therapy [facility] for tasks assessed/performed              Past Medical History:  Diagnosis Date   Breast cancer (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   Diverticulosis    History of shingles    Hypercholesteremia    Hyperlipidemia    Iron deficiency    Iron deficiency anemia 07/19/2010   Necrosis, breast fat    Osteopenia    Pars flaccida    lumbar back   PE (pulmonary embolism)    Pulled muscle may 2013   back   Pulmonary embolism ([place]) 07/19/2010   Tendonitis of elbow, right    History of   Past Surgical History:  Procedure Laterality Date   COLONOSCOPY N/A 11/05/2018   Procedure: COLONOSCOPY;  Surgeon: Rogene Houston, MD;  Location: [facility];   Service: Endoscopy;  Laterality: N/A;  51   EYE SURGERY     LAPAROSCOPIC NISSEN FUNDOPLICATION  5726   MASTECTOMY     right mastectomy with TRAM   MASTECTOMY W/ NODES PARTIAL     had right partial mastectomy with sentinel lymph node biopsy   POLYPECTOMY  11/05/2018   Procedure: POLYPECTOMY;  Surgeon: Rogene Houston, MD;  Location: [facility];  Service: Endoscopy;;  colon   THROAT SURGERY     left vocal cord implants   TRAM     vocal cord surgery     left   Patient Active Problem List   Diagnosis Date Noted   Hx of colonic polyps 08/07/2018   Gastroesophageal reflux disease without esophagitis 01/02/2017   Carpal tunnel syndrome of right wrist 09/19/2016   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   Iron deficiency anemia 07/19/2010   Pulmonary embolism ([place]) 07/19/2010   Tendonitis of elbow, right    DERANGEMENT OF POSTERIOR HORN OF MEDIAL MENISCUS 02/27/2010   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   ILIOTIBIAL BAND SYNDROME, LEFT KNEE 02/27/2010    PCP: Allyn Kenner MD  REFERRING PROVIDER: Carole Civil,  MD   REFERRING DIAG: M54.50,M79.604,M79.605 (ICD-10-CM) - Lumbar pain with radiation down both legs   THERAPY DIAG:  Other low back pain  Other abnormalities of gait and mobility  Other symptoms and signs involving the musculoskeletal system  ONSET DATE: October/November 2022  SUBJECTIVE:                                                                                                                                                                                           SUBJECTIVE STATEMENT: Pt reports she has been 58 percent free since Thursday. Pt reports that she bent over yesterday after bending over, no pian this morning.   PERTINENT HISTORY:  Chronic LBP, Pars defect, Hx breast cancer, HLD, osteopenia  PAIN:  Are you having pain? Yes: NPRS scale: 0/10 Pain location: low back  Pain description: nagging Aggravating factors: lifting,  digging Relieving factors: meds   PRECAUTIONS: None  WEIGHT BEARING RESTRICTIONS No  FALLS:  Has patient fallen in last 6 months? 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  PLOF: Independent  PATIENT GOALS decrease pain   OBJECTIVE:   PATIENT SURVEYS:  FOTO 51% function  SCREENING FOR RED FLAGS: Bowel or bladder incontinence: No Spinal tumors: No Cauda equina syndrome: No Compression fracture: No Abdominal aneurysm: No  COGNITION:  Overall cognitive status: Within functional limits for tasks assessed     SENSATION: WFL  POSTURE:  WFL  PALPATION: TTP lower lumbar paraspinals  LUMBAR ROM:   Active  A/PROM  06/15/2021  Flexion 0% limited  Extension 25% limited*  Right lateral flexion 0% limited*  Left lateral flexion 0% limited*  Right rotation 25% limited*  Left rotation 25% limited*   (Blank rows = not tested) *=pain  LE ROM: WFL, lacking slight hip extension bilaterally  Active  Right 06/15/2021 Left 06/15/2021  Hip flexion    Hip extension    Hip abduction    Hip adduction    Hip internal rotation    Hip external rotation    Knee flexion    Knee extension    Ankle dorsiflexion    Ankle plantarflexion    Ankle inversion    Ankle eversion     (Blank rows = not tested)  LE MMT: intermittent back pain with bracing for MMT  MMT Right 06/15/2021 Left 06/15/2021  Hip flexion 4/5 4+/5  Hip extension 4-/5 3+/5  Hip abduction 4-/5 4-/5  Hip adduction    Hip  internal rotation    Hip external rotation    Knee flexion 5/5 5/5  Knee extension 5/5 5/5  Ankle dorsiflexion 5/5 5/5  Ankle plantarflexion    Ankle inversion    Ankle eversion     (Blank rows = not tested)    FUNCTIONAL TESTS:  30 seconds chair stand test 12 reps 2 minute walk  test: 975 Feet   GAIT: Distance walked: 975 feet Assistive device utilized: None Level of assistance: Complete Independence Comments: 2 MWT    TODAY'S TREATMENT  07/03/21 Physioball lumbar trunk rotation x20 Physioball dktc x15 90/90 LE position self manual isometric hip abd/add x15 with 3 sec holds Bridge with isometric abd against green theraband 3x10 Long sit hip flexion lifts up and in/out 2x10 b/l Sidesteps at cable columns x3 plates 5laps b/l  Standing hip abd 2plates 2x15 b/l  07/07/60: Seated: anterior and posterior pelvic tilt MET for Lt SI outflare Bridge Lt close SLR Rt only 2MWT 985f with ab set, able to slow down without pain Supine: clam with RTB with ab set 10x 5" each  Dead Bug   5June 06, 2023 MET for Lt SI outflare Bridge Lt close SLR Rt only Pubic clearing isometric hip adduction with ball and abduction with belt Posterior pelvic tilt Bent floor raise with ab set 2MWT with ab set, different speed able to walk slow without increased pain.  Attempted quadruped UE- increased thumb pain so stopped   06/20/21 - Figure 4 Bridge  - Bilateral Bent Leg Lift   - Dead Bug   - Hooklying Isometric Hip Abduction Adduction with Belt and Ball  - Quadruped Cat Cow   - Supine Lower Trunk Rotation  - Seated Table Hamstring Stretch   - Seated Piriformis Stretch with Trunk Bend  - cable walkouts 2 plates retro x5 -APT, PPT in standing   06/15/21 Ab set 10 x 5 second holds March with ab set 10 x bilateral Bridge 2x 10   PATIENT EDUCATION:  Education details: updated HEP. Person educated: Patient Education method: Explanation, Demonstration, and Handouts Education comprehension: verbalized understanding, returned demonstration, verbal cues required, and tactile cues required   HOME EXERCISE PROGRAM:  Access Code: [email or web address] URL: [email or web address] Date: 06/20/2021 Prepared by: JLeota Jacobsen Exercises - Figure 4 Bridge  - 1 x daily - 7 x  weekly - 3 sets - 10 reps - Bilateral Bent Leg Lift  - 1 x daily - 7 x weekly - 3 sets - 10 reps - Dead Bug  - 1 x daily - 7 x weekly - 3 sets - 10 reps - 3 hold - Hooklying Isometric Hip Abduction Adduction with Belt and Ball  - 1 x daily - 7 x weekly - 3 sets - 10 reps - 3 hold - Quadruped Cat Cow  - 1 x daily - 7 x weekly - 1 sets - 7 reps - Supine Lower Trunk Rotation  - 1 x daily - 7 x weekly - 3 sets - 10 reps - 3 hold - Seated Table Hamstring Stretch  - 2 x daily - 7 x weekly - 1 sets - 3 reps - 15 hold - Seated Piriformis Stretch with Trunk Bend  - 1 x daily - 7 x weekly - 1 sets - 3 reps - 15 hold   5/18/23Access Code: [email or web address] - Abdominal Bracing  - 2 x daily - 7 x weekly - 2 sets - 10 reps - 5 second hold - Supine March  - 2 x daily -  7 x weekly - 3 sets - 10 reps - Supine Bridge  - 2 x daily - 7 x weekly - 3 sets - 10 reps  06/29/21: clam with RTB  ASSESSMENT:  CLINICAL IMPRESSION: Patient tolerates session well with no reports of inc pain. Reports only feeling stretching with Physioball LTR. Patient able to perform increased LE and core strengthening with good stabilization of back. Patient reports feeling of increased effort with left side steps out on cable column. Patient will continue to benefit from LE/core strength to work towards consistent pain free mobility.   OBJECTIVE IMPAIRMENTS decreased activity tolerance, decreased mobility, difficulty walking, decreased ROM, decreased strength, increased muscle spasms, impaired flexibility, improper body mechanics, postural dysfunction, and pain.   ACTIVITY LIMITATIONS cleaning, community activity, meal prep, occupation, yard work, and shopping.   PERSONAL FACTORS Age, Time since onset of injury/illness/exacerbation, and 3+ comorbidities: chronic LBP/sciatica, pars defect, hx cancer, HLD, osteopenia  are also affecting patient's functional outcome.    REHAB POTENTIAL: Good  CLINICAL DECISION MAKING:  Stable/uncomplicated  EVALUATION COMPLEXITY: Low   GOALS: Goals reviewed with patient? Yes  SHORT TERM GOALS: Target date: 07/06/2021  Patient will be independent with HEP in order to improve functional outcomes. Baseline:  Goal status: Ongoing  2.  Patient will report at least 25% improvement in symptoms for improved quality of life. Baseline:  Goal status:  Ongoing    LONG TERM GOALS: Target date: 07/27/2021  Patient will report at least 75% improvement in symptoms for improved quality of life. Baseline:  Goal status: Ongoing  2.  Patient will improve FOTO score by at least 10 points in order to indicate improved tolerance to activity. Baseline: 51% function Goal status:  Ongoing  3.  Patient will demonstrate at least 25% improvement in lumbar ROM in all restricted planes for improved ability to move trunk while completing chores. Baseline: see ROM Goal status: Ongoing  4.  Patient will demonstrate grade of 5/5 MMT grade in all tested musculature as evidence of improved strength to assist with stair ambulation and gait.   Baseline: see MMT Goal status:  Ongoing  5.  Patient will be able to return to all activities unrestricted for improved ability to perform work functions and participate with friends/family. Baseline:  Goal status: Ongoing   PLAN: PT FREQUENCY: 2x/week  PT DURATION: 6 weeks  PLANNED INTERVENTIONS: Therapeutic exercises, Therapeutic activity, Neuromuscular re-education, Balance training, Gait training, Patient/Family education, Joint manipulation, Joint mobilization, Stair training, Orthotic/Fit training, DME instructions, Aquatic Therapy, Dry Needling, Electrical stimulation, Spinal manipulation, Spinal mobilization, Cryotherapy, Moist heat, Compression bandaging, scar mobilization, Splintting, Taping, Traction, Ultrasound, Ionotophoresis 37m/ml Dexamethasone, and Manual therapy   PLAN FOR NEXT SESSION: f/u with HEP and walking program, core and hip  strength, progress as toelrated    Ioane Bhola, PT 07/03/2021, 8:23 AM

## 2021-07-03 NOTE — Progress Notes (Signed)
FOLLOW UP   Encounter Diagnoses  Name Primary?   Lumbar pain with radiation down both legs Yes   Spondylolysis, lumbar region    Spondylolisthesis, lumbar region      Chief Complaint  Patient presents with   Follow-up    Recheck on back pain.   Kaitlyn Morrison is 99% better with physical therapy however she is having some recurrent pain in her right knee.  She seems to have anterior knee pain which she says is getting worse  Reexamination showed no meniscal signs passive range of motion was normal mild crepitance in the patellofemoral joint  She has some tenderness over the medial femoral condyle  We offered her an injection but she declined, her to the hospital as she is afraid if she gets the injection she will be able to walk the distance she needs to get to see her  She will come back in 4 to 5 weeks to reconsider injection in the right knee   Encounter Diagnoses  Name Primary?   Lumbar pain with radiation down both legs Yes   Spondylolysis, lumbar region    Spondylolisthesis, lumbar region

## 2021-07-05 ENCOUNTER — Ambulatory Visit (HOSPITAL_COMMUNITY): Payer: BC Managed Care – PPO

## 2021-07-05 DIAGNOSIS — M5459 Other low back pain: Secondary | ICD-10-CM

## 2021-07-05 DIAGNOSIS — R29898 Other symptoms and signs involving the musculoskeletal system: Secondary | ICD-10-CM

## 2021-07-05 DIAGNOSIS — R2689 Other abnormalities of gait and mobility: Secondary | ICD-10-CM

## 2021-07-05 NOTE — Therapy (Signed)
OUTPATIENT PHYSICAL THERAPY THORACOLUMBAR TREATMENT   Patient Name: Kaitlyn Morrison MRN: [id number] DOB:[birth year], 66 y.o., female Today's Date: 07/05/2021  END OF SESSION:   PT End of Session - 07/05/21 0813     Visit Number 6    Number of Visits 12    Date for PT Re-Evaluation 07/27/21    Authorization Type BCBS(no visit limit, no auth required)    PT Start Time 0815    PT Stop Time 0858    PT Time Calculation (min) 43 min    Activity Tolerance Patient tolerated treatment well    Behavior During Therapy [facility] for tasks assessed/performed             Visit Number: 3 Number of visits: 12 Re-eval date: 07/27/21 Authorization time: BCBS (no visit limit, no auth required) Start time: 1622 Stop time: 1700 Time calculation: 38 Activitiy tolerance:  Patient tolerated treatment well Behavior during treatment: [facility] for tasks assessed/performed  PT End of Session - 07/05/21 0813     Visit Number 6    Number of Visits 12    Date for PT Re-Evaluation 07/27/21    Authorization Type BCBS(no visit limit, no auth required)    PT Start Time 0815    PT Stop Time 0858    PT Time Calculation (min) 43 min    Activity Tolerance Patient tolerated treatment well    Behavior During Therapy [facility] for tasks assessed/performed               Past Medical History:  Diagnosis Date   Breast cancer ([place]) 2000   right   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   Diverticulosis    History of shingles    Hypercholesteremia    Hyperlipidemia    Iron deficiency    Iron deficiency anemia 07/19/2010   Necrosis, breast fat    Osteopenia    Pars flaccida    lumbar back   PE (pulmonary embolism)    Pulled muscle may 2013   back   Pulmonary embolism ([place]) 07/19/2010   Tendonitis of elbow, right    History of   Past Surgical History:  Procedure Laterality Date   COLONOSCOPY N/A 11/05/2018   Procedure: COLONOSCOPY;  Surgeon: Rogene Houston, MD;  Location: [facility];  Service: Endoscopy;  Laterality: N/A;  88   EYE SURGERY     LAPAROSCOPIC NISSEN FUNDOPLICATION  9323   MASTECTOMY     right mastectomy with TRAM   MASTECTOMY W/ NODES PARTIAL     had right partial mastectomy with sentinel lymph node biopsy   POLYPECTOMY  11/05/2018   Procedure: POLYPECTOMY;  Surgeon: Rogene Houston, MD;  Location: [facility];  Service: Endoscopy;;  colon   THROAT SURGERY     left vocal cord implants   TRAM     vocal cord surgery     left   Patient Active Problem List   Diagnosis Date Noted   Hx of colonic polyps 08/07/2018   Gastroesophageal reflux disease without esophagitis 01/02/2017   Carpal tunnel syndrome of right wrist 09/19/2016   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   Iron deficiency anemia 07/19/2010   Pulmonary embolism ([place]) 07/19/2010   Tendonitis of elbow, right    DERANGEMENT OF POSTERIOR HORN OF MEDIAL MENISCUS 02/27/2010   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   ILIOTIBIAL BAND SYNDROME, LEFT KNEE 02/27/2010    PCP: Allyn Kenner MD  REFERRING PROVIDER: Aline Brochure,  Tim Lair, MD   REFERRING DIAG: M54.50,M79.604,M79.605 (ICD-10-CM) - Lumbar pain with radiation down both legs   THERAPY DIAG:  Other low back pain  Other abnormalities of gait and mobility  Other symptoms and signs involving the musculoskeletal system  ONSET DATE: October/November 2022  SUBJECTIVE:                                                                                                                                                                                           SUBJECTIVE STATEMENT: No pain in the back today; pleased with progress; having some Right knee pain Dr. Aline Brochure wants to give her a shot in 3 weeks. Been going to see her aunt who is currently in CIR.   PERTINENT HISTORY:  Chronic LBP, Pars defect, Hx breast cancer, HLD, osteopenia  PAIN:  Are you having pain? Yes: NPRS scale: 0/10 Pain location: low back  Pain  description: nagging Aggravating factors: lifting, digging Relieving factors: meds   PRECAUTIONS: None  WEIGHT BEARING RESTRICTIONS No  FALLS:  Has patient fallen in last 6 months? 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  PLOF: Independent  PATIENT GOALS decrease pain   OBJECTIVE:   PATIENT SURVEYS:  FOTO 51% function  SCREENING FOR RED FLAGS: Bowel or bladder incontinence: No Spinal tumors: No Cauda equina syndrome: No Compression fracture: No Abdominal aneurysm: No  COGNITION:  Overall cognitive status: Within functional limits for tasks assessed     SENSATION: WFL  POSTURE:  WFL  PALPATION: TTP lower lumbar paraspinals  LUMBAR ROM:   Active  A/PROM  06/15/2021 AROM 07/05/2021  Flexion 0% limited wfl  Extension 25% limited* wfl  Right lateral flexion 0% limited* wfl  Left lateral flexion 0% limited* wfl  Right rotation 25% limited* wfl  Left rotation 25% limited* Wfl** painful   (Blank rows = not tested) *=pain  LE ROM: WFL, lacking slight hip extension bilaterally  Active  Right 06/15/2021 Left 06/15/2021  Hip flexion    Hip extension    Hip abduction    Hip adduction    Hip internal rotation    Hip external rotation    Knee flexion    Knee extension    Ankle dorsiflexion    Ankle plantarflexion    Ankle inversion    Ankle eversion     (Blank rows = not tested)  LE MMT: intermittent back pain with bracing for MMT  MMT Right 06/15/2021 Left  06/15/2021 Right 07/05/2021 Left 07/05/2021  Hip flexion 4/5 4+/5 5 5   Hip extension 4-/5 3+/5 4+ 4+  Hip abduction 4-/5 4-/5 5 5   Hip adduction      Hip internal rotation      Hip external rotation      Knee flexion 5/5 5/5 5 5   Knee extension 5/5 5/5 5 5   Ankle dorsiflexion 5/5 5/5 5 5   Ankle  plantarflexion      Ankle inversion      Ankle eversion       (Blank rows = not tested)    FUNCTIONAL TESTS:  30 seconds chair stand test 12 reps 2 minute walk test: 975 Feet   GAIT: Distance walked: 975 feet Assistive device utilized: None Level of assistance: Complete Independence Comments: 2 MWT    TODAY'S TREATMENT  07/05/2021 Hamstring stretch supine 3 x 20" Dead bug with feet in 90/90 position 2 x 10 Bridge with ball squeeze 2 x 10 Bridge with belt hip abduction 2 x 10 Bridge with UE shoulder flexion alternating 2#'s 2 x 5 LTR x 10 Long sit hip flexion lifts up and in/out 2x10 bilaterally Physioball squats 2 x 10 Standing hip flexion holding 3# in opposite hand x 5 each    07/03/21 Physioball lumbar trunk rotation x20 Physioball dktc x15 90/90 LE position self manual isometric hip abd/add x15 with 3 sec holds Bridge with isometric abd against green theraband 3x10 Long sit hip flexion lifts up and in/out 2x10 b/l Sidesteps at cable columns x3 plates 5laps b/l  Standing hip abd 2plates 2x15 b/l  9/0/24: Seated: anterior and posterior pelvic tilt MET for Lt SI outflare Bridge Lt close SLR Rt only 2MWT 937f with ab set, able to slow down without pain Supine: clam with RTB with ab set 10x 5" each  Dead Bug   520-Jun-2023 MET for Lt SI outflare Bridge Lt close SLR Rt only Pubic clearing isometric hip adduction with ball and abduction with belt Posterior pelvic tilt Bent floor raise with ab set 2MWT with ab set, different speed able to walk slow without increased pain.  Attempted quadruped UE- increased thumb pain so stopped   06/20/21 - Figure 4 Bridge  - Bilateral Bent Leg Lift   - Dead Bug   - Hooklying Isometric Hip Abduction Adduction with Belt and Ball  - Quadruped Cat Cow   - Supine Lower Trunk Rotation  - Seated Table Hamstring Stretch   - Seated Piriformis Stretch with Trunk Bend  - cable walkouts 2 plates retro x5 -APT, PPT in standing    06/15/21 Ab set 10 x 5 second holds March with ab set 10 x bilateral Bridge 2x 10   PATIENT EDUCATION:  Education details: updated HEP. Person educated: Patient Education method: Explanation, Demonstration, and Handouts Education comprehension: verbalized understanding, returned demonstration, verbal cues required, and tactile cues required   HOME EXERCISE PROGRAM:  Access Code: [email or web address] URL: [email or web address] Date: 06/20/2021 Prepared by: JLeota Jacobsen Exercises - Figure 4 Bridge  - 1 x daily - 7 x weekly - 3 sets - 10 reps - Bilateral Bent Leg Lift  - 1 x daily - 7 x weekly - 3 sets - 10 reps - Dead Bug  - 1 x daily - 7 x weekly - 3 sets - 10 reps - 3 hold - Hooklying Isometric Hip Abduction Adduction with Belt and Ball  - 1 x daily - 7 x weekly - 3 sets - 10 reps - 3 hold -  Quadruped Cat Cow  - 1 x daily - 7 x weekly - 1 sets - 7 reps - Supine Lower Trunk Rotation  - 1 x daily - 7 x weekly - 3 sets - 10 reps - 3 hold - Seated Table Hamstring Stretch  - 2 x daily - 7 x weekly - 1 sets - 3 reps - 15 hold - Seated Piriformis Stretch with Trunk Bend  - 1 x daily - 7 x weekly - 1 sets - 3 reps - 15 hold   5/18/23Access Code: [email or web address] - Abdominal Bracing  - 2 x daily - 7 x weekly - 2 sets - 10 reps - 5 second hold - Supine March  - 2 x daily - 7 x weekly - 3 sets - 10 reps - Supine Bridge  - 2 x daily - 7 x weekly - 3 sets - 10 reps  06/29/21: clam with RTB  ASSESSMENT:  CLINICAL IMPRESSION: Patient still pain free on arrival; AROM of lumbar spine is wfl; has 4/10 pain at end range left lumbar rotation only.  Patient getting close to discharge. Good improvement with strength. Patient tolerates progression of bridge exercise well.  Patient will continue to benefit from LE/core strength to work towards consistent pain free mobility.   OBJECTIVE IMPAIRMENTS decreased activity tolerance, decreased mobility, difficulty walking, decreased ROM, decreased  strength, increased muscle spasms, impaired flexibility, improper body mechanics, postural dysfunction, and pain.   ACTIVITY LIMITATIONS cleaning, community activity, meal prep, occupation, yard work, and shopping.   PERSONAL FACTORS Age, Time since onset of injury/illness/exacerbation, and 3+ comorbidities: chronic LBP/sciatica, pars defect, hx cancer, HLD, osteopenia  are also affecting patient's functional outcome.    REHAB POTENTIAL: Good  CLINICAL DECISION MAKING: Stable/uncomplicated  EVALUATION COMPLEXITY: Low   GOALS: Goals reviewed with patient? Yes  SHORT TERM GOALS: Target date: 07/06/2021  Patient will be independent with HEP in order to improve functional outcomes. Baseline:  Goal status: met  2.  Patient will report at least 25% improvement in symptoms for improved quality of life. Baseline:  Goal status:  met    LONG TERM GOALS: Target date: 07/27/2021  Patient will report at least 75% improvement in symptoms for improved quality of life. Baseline:  Goal status: met  2.  Patient will improve FOTO score by at least 10 points in order to indicate improved tolerance to activity. Baseline: 51% function Goal status:  Ongoing  3.  Patient will demonstrate at least 25% improvement in lumbar ROM in all restricted planes for improved ability to move trunk while completing chores. Baseline: see ROM Goal status: met  4.  Patient will demonstrate grade of 5/5 MMT grade in all tested musculature as evidence of improved strength to assist with stair ambulation and gait.   Baseline: see MMT; hip extension 4+/5 bilat Goal status:  partially met  5.  Patient will be able to return to all activities unrestricted for improved ability to perform work functions and participate with friends/family. Baseline:  Goal status: Ongoing   PLAN: PT FREQUENCY: 2x/week  PT DURATION: 6 weeks  PLANNED INTERVENTIONS: Therapeutic exercises, Therapeutic activity, Neuromuscular  re-education, Balance training, Gait training, Patient/Family education, Joint manipulation, Joint mobilization, Stair training, Orthotic/Fit training, DME instructions, Aquatic Therapy, Dry Needling, Electrical stimulation, Spinal manipulation, Spinal mobilization, Cryotherapy, Moist heat, Compression bandaging, scar mobilization, Splintting, Taping, Traction, Ultrasound, Ionotophoresis 77m/ml Dexamethasone, and Manual therapy   PLAN FOR NEXT SESSION: f/u with HEP and walking program, core and hip strength, progress as tolerated  9:02 AM, 07/05/21 Phyllis Whitefield Small Caffie Sotto MPT [facility] [place] [phone]

## 2021-07-10 ENCOUNTER — Encounter (HOSPITAL_COMMUNITY): Payer: BC Managed Care – PPO

## 2021-07-12 ENCOUNTER — Encounter (HOSPITAL_COMMUNITY): Payer: BC Managed Care – PPO

## 2021-07-17 ENCOUNTER — Encounter (HOSPITAL_COMMUNITY): Payer: BC Managed Care – PPO

## 2021-07-19 ENCOUNTER — Ambulatory Visit: Payer: BC Managed Care – PPO

## 2021-07-19 ENCOUNTER — Encounter (HOSPITAL_COMMUNITY): Payer: BC Managed Care – PPO

## 2021-07-24 ENCOUNTER — Ambulatory Visit (HOSPITAL_COMMUNITY): Payer: BC Managed Care – PPO

## 2021-07-24 ENCOUNTER — Ambulatory Visit
Admission: RE | Admit: 2021-07-24 | Discharge: 2021-07-24 | Disposition: A | Discharge status: Home / Self Care | Payer: BC Managed Care – PPO | Source: Ambulatory Visit | Attending: Internal Medicine | Admitting: Internal Medicine

## 2021-07-24 ENCOUNTER — Encounter (HOSPITAL_COMMUNITY): Payer: Self-pay

## 2021-07-24 DIAGNOSIS — R2689 Other abnormalities of gait and mobility: Secondary | ICD-10-CM

## 2021-07-24 DIAGNOSIS — R29898 Other symptoms and signs involving the musculoskeletal system: Secondary | ICD-10-CM

## 2021-07-24 DIAGNOSIS — Z1231 Encounter for screening mammogram for malignant neoplasm of breast: Secondary | ICD-10-CM

## 2021-07-24 DIAGNOSIS — M5459 Other low back pain: Secondary | ICD-10-CM

## 2021-07-24 NOTE — Therapy (Signed)
OUTPATIENT PHYSICAL THERAPY THORACOLUMBAR TREATMENT   Patient Name: Kaitlyn Morrison MRN: [id number] DOB:1955-02-20, 66 y.o., female Today's Date: 07/24/2021  PHYSICAL THERAPY DISCHARGE SUMMARY  Visits from Start of Care: 7  Current functional level related to goals / functional outcomes: Met all goals, independent and pain free for past 2 weeks   Remaining deficits: none   Education / Equipment: Pt independent in HEP   Patient agrees to discharge. Patient goals were met. Patient is being discharged due to meeting the stated rehab goals.   END OF SESSION:   PT End of Session - 07/24/21 0735     Visit Number 7    Number of Visits 12    Date for PT Re-Evaluation 07/27/21    Authorization Type BCBS(no visit limit, no auth required)    PT Start Time 0735    PT Stop Time 0749    PT Time Calculation (min) 14 min    Activity Tolerance Patient tolerated treatment well    Behavior During Therapy [facility] for tasks assessed/performed             Visit Number: 3 Number of visits: 12 Re-eval date: 07/27/21 Authorization time: BCBS (no visit limit, no auth required) Start time: 1622 Stop time: 1700 Time calculation: 38 Activitiy tolerance:  Patient tolerated treatment well Behavior during treatment: [facility] for tasks assessed/performed  PT End of Session - 07/24/21 0735     Visit Number 7    Number of Visits 12    Date for PT Re-Evaluation 07/27/21    Authorization Type BCBS(no visit limit, no auth required)    PT Start Time 0735    PT Stop Time 0749    PT Time Calculation (min) 14 min    Activity Tolerance Patient tolerated treatment well    Behavior During Therapy [facility] for tasks assessed/performed               Past Medical History:  Diagnosis Date   Breast cancer (HCC) 2000   right   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   Diverticulosis    History of shingles    Hypercholesteremia    Hyperlipidemia    Iron deficiency    Iron  deficiency anemia 07/19/2010   Necrosis, breast fat    Osteopenia    Pars flaccida    lumbar back   PE (pulmonary embolism)    Pulled muscle may 2013   back   Pulmonary embolism (HCC) 07/19/2010   Tendonitis of elbow, right    History of   Past Surgical History:  Procedure Laterality Date   COLONOSCOPY N/A 11/05/2018   Procedure: COLONOSCOPY;  Surgeon: Malissa Hippo, MD;  [facility];  Service: Endoscopy;  Laterality: N/A;  1030   EYE SURGERY     LAPAROSCOPIC NISSEN FUNDOPLICATION  2018   MASTECTOMY     right mastectomy with TRAM   MASTECTOMY W/ NODES PARTIAL     had right partial mastectomy with sentinel lymph node biopsy   POLYPECTOMY  11/05/2018   Procedure: POLYPECTOMY;  Surgeon: Malissa Hippo, MD;  Location: [facility];  Service: Endoscopy;;  colon   THROAT SURGERY     left vocal cord implants   TRAM     vocal cord surgery     left   Patient Active Problem List   Diagnosis Date Noted   Hx of colonic polyps 08/07/2018   Gastroesophageal reflux disease without esophagitis 01/02/2017   Carpal tunnel  syndrome of right wrist 09/19/2016   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   Iron deficiency anemia 07/19/2010   Pulmonary embolism (HCC) 07/19/2010   Tendonitis of elbow, right    DERANGEMENT OF POSTERIOR HORN OF MEDIAL MENISCUS 02/27/2010   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   ILIOTIBIAL BAND SYNDROME, LEFT KNEE 02/27/2010    PCP: Nita Sells MD  REFERRING PROVIDER: Vickki Hearing, MD   REFERRING DIAG: M54.50,M79.604,M79.605 (ICD-10-CM) - Lumbar pain with radiation down both legs   THERAPY DIAG:  Other low back pain  Other abnormalities of gait and mobility  Other symptoms and signs involving the musculoskeletal system  ONSET DATE: October/November 2022  SUBJECTIVE:                                                                                                                                                                                            SUBJECTIVE STATEMENT: Patient reports she has been pain free for past 2 weeks. Feeling ready for discharge.   PERTINENT HISTORY:  Chronic LBP, Pars defect, Hx breast cancer, HLD, osteopenia  PAIN:  Are you having pain? Yes: NPRS scale: 0/10 Pain location: low back  Pain description: nagging Aggravating factors: lifting, digging Relieving factors: meds   PRECAUTIONS: None  WEIGHT BEARING RESTRICTIONS No  FALLS:  Has patient fallen in last 6 months? No  LIVING ENVIRONMENT: Lives with: lives alone Lives in: House/apartment Stairs: Yes: External: 6 steps; on left going up Has following equipment at home: Single point cane and Walker - 2 wheeled  OCCUPATION: Compliance Production designer, theatre/television/film - desk work  PLOF: Independent  PATIENT GOALS decrease pain   OBJECTIVE:   PATIENT SURVEYS:  FOTO 51% function 07/24/21 FOTO 79% function  SCREENING FOR RED FLAGS: Bowel or bladder incontinence: No Spinal tumors: No Cauda equina syndrome: No Compression fracture: No Abdominal aneurysm: No  COGNITION:  Overall cognitive status: Within functional limits for tasks assessed     SENSATION: WFL  POSTURE:  WFL  PALPATION: TTP lower lumbar paraspinals  LUMBAR ROM:   Active  A/PROM  06/15/2021 AROM 07/05/2021  Flexion 0% limited wfl  Extension 25% limited* wfl  Right lateral flexion 0% limited* wfl  Left lateral flexion 0% limited* wfl  Right rotation 25% limited* wfl  Left rotation 25% limited* Wfl** painful   (Blank rows = not tested) *=pain  LE ROM: WFL, lacking slight hip extension bilaterally  Active  Right 06/15/2021 Left 06/15/2021  Hip flexion    Hip extension    Hip abduction    Hip adduction    Hip internal rotation    Hip external rotation  Knee flexion    Knee extension    Ankle dorsiflexion    Ankle plantarflexion    Ankle inversion    Ankle eversion     (Blank rows = not tested)  LE MMT: intermittent back pain with bracing for  MMT  MMT Right 06/15/2021 Left 06/15/2021 Right 07/05/2021 + 07/24/21 Left 07/05/2021+ 07/24/21  Hip flexion 4/5 4+/5 5 5   Hip extension 4-/5 3+/5 4+ 4+  Hip abduction 4-/5 4-/5 5 5   Hip adduction      Hip internal rotation      Hip external rotation      Knee flexion 5/5 5/5 5 5   Knee extension 5/5 5/5 5 5   Ankle dorsiflexion 5/5 5/5 5 5   Ankle plantarflexion      Ankle inversion      Ankle eversion       (Blank rows = not tested)    FUNCTIONAL TESTS:  30 seconds chair stand test 12 reps 2 minute walk test: 975 Feet     07/24/21 708'  GAIT: Distance walked: 975 feet Assistive device utilized: None Level of assistance: Complete Independence Comments: 2 MWT    TODAY'S TREATMENT  07/21/24 2 mwt Reassessment of mmt Foto survey  07/05/2021 Hamstring stretch supine 3 x 20" Dead bug with feet in 90/90 position 2 x 10 Bridge with ball squeeze 2 x 10 Bridge with belt hip abduction 2 x 10 Bridge with UE shoulder flexion alternating 2#'s 2 x 5 LTR x 10 Long sit hip flexion lifts up and in/out 2x10 bilaterally Physioball squats 2 x 10 Standing hip flexion holding 3# in opposite hand x 5 each    07/03/21 Physioball lumbar trunk rotation x20 Physioball dktc x15 90/90 LE position self manual isometric hip abd/add x15 with 3 sec holds Bridge with isometric abd against green theraband 3x10 Long sit hip flexion lifts up and in/out 2x10 b/l Sidesteps at cable columns x3 plates 5laps b/l  Standing hip abd 2plates 2x15 b/l  06/29/21: Seated: anterior and posterior pelvic tilt MET for Lt SI outflare Bridge Lt close SLR Rt only 950ft with ab set, able to slow down without pain Supine: clam with RTB with ab set 10x 5" each  Dead Bug   06/26/2021: MET for Lt SI outflare Bridge Lt close SLR Rt only Pubic clearing isometric hip adduction with ball and abduction with belt Posterior pelvic tilt Bent floor raise with ab set with ab set, different speed able to walk slow  without increased pain.  Attempted quadruped UE- increased thumb pain so stopped   06/20/21 - Figure 4 Bridge  - Bilateral Bent Leg Lift   - Dead Bug   - Hooklying Isometric Hip Abduction Adduction with Belt and Ball  - Quadruped Cat Cow   - Supine Lower Trunk Rotation  - Seated Table Hamstring Stretch   - Seated Piriformis Stretch with Trunk Bend  - cable walkouts 2 plates retro x5 -APT, PPT in standing   06/15/21 Ab set 10 x 5 second holds March with ab set 10 x bilateral Bridge 2x 10   PATIENT EDUCATION:  Education details: updated HEP. Person educated: Patient Education method: Explanation, Demonstration, and Handouts Education comprehension: verbalized understanding, returned demonstration, verbal cues required, and tactile cues required   HOME EXERCISE PROGRAM:  Access Code: [email or web address] URL: [email or web address] Date: 06/20/2021 Prepared by: Aleatha Borer  Exercises - Figure 4 Bridge  - 1 x daily - 7 x weekly - 3 sets - 10 reps - Bilateral  Bent Leg Lift  - 1 x daily - 7 x weekly - 3 sets - 10 reps - Dead Bug  - 1 x daily - 7 x weekly - 3 sets - 10 reps - 3 hold - Hooklying Isometric Hip Abduction Adduction with Belt and Ball  - 1 x daily - 7 x weekly - 3 sets - 10 reps - 3 hold - Quadruped Cat Cow  - 1 x daily - 7 x weekly - 1 sets - 7 reps - Supine Lower Trunk Rotation  - 1 x daily - 7 x weekly - 3 sets - 10 reps - 3 hold - Seated Table Hamstring Stretch  - 2 x daily - 7 x weekly - 1 sets - 3 reps - 15 hold - Seated Piriformis Stretch with Trunk Bend  - 1 x daily - 7 x weekly - 1 sets - 3 reps - 15 hold   5/18/23Access Code: [email or web address] - Abdominal Bracing  - 2 x daily - 7 x weekly - 2 sets - 10 reps - 5 second hold - Supine March  - 2 x daily - 7 x weekly - 3 sets - 10 reps - Supine Bridge  - 2 x daily - 7 x weekly - 3 sets - 10 reps  06/29/21: clam with RTB  ASSESSMENT:  CLINICAL IMPRESSION: Patient arrives stating that she has been pain free  for past 2 weeks and is feeling ready for discharge. In performing FOTO pt states she does have some heavy exercises/ activites she would avoid because she thinks they would cause pain but for the most part is able to be aware of musculature to better help herself and not have pain when performing usual house hold duties and activities. Patient has met all goals at this time.   OBJECTIVE IMPAIRMENTS decreased activity tolerance, decreased mobility, difficulty walking, decreased ROM, decreased strength, increased muscle spasms, impaired flexibility, improper body mechanics, postural dysfunction, and pain.   ACTIVITY LIMITATIONS cleaning, community activity, meal prep, occupation, yard work, and shopping.   PERSONAL FACTORS Age, Time since onset of injury/illness/exacerbation, and 3+ comorbidities: chronic LBP/sciatica, pars defect, hx cancer, HLD, osteopenia  are also affecting patient's functional outcome.    REHAB POTENTIAL: Good  CLINICAL DECISION MAKING: Stable/uncomplicated  EVALUATION COMPLEXITY: Low   GOALS: Goals reviewed with patient? Yes  SHORT TERM GOALS: Target date: 07/06/2021  Patient will be independent with HEP in order to improve functional outcomes. Baseline:  Goal status: met  2.  Patient will report at least 25% improvement in symptoms for improved quality of life. Baseline:  Goal status:  met    LONG TERM GOALS: Target date: 07/27/2021  Patient will report at least 75% improvement in symptoms for improved quality of life. Baseline:  Goal status: met  2.  Patient will improve FOTO score by at least 10 points in order to indicate improved tolerance to activity. Baseline: 51% function Current: 79% function Goal status:  met  3.  Patient will demonstrate at least 25% improvement in lumbar ROM in all restricted planes for improved ability to move trunk while completing chores. Baseline: see ROM Goal status: met  4.  Patient will demonstrate grade of 5/5 MMT  grade in all tested musculature as evidence of improved strength to assist with stair ambulation and gait.   Baseline: see MMT; hip extension 4+/5 bilat Goal status:  partially met  5.  Patient will be able to return to all activities unrestricted for improved ability to perform  work functions and participate with friends/family. Baseline:  Goal status: met   PLAN: PT FREQUENCY: 2x/week  PT DURATION: 6 weeks  PLANNED INTERVENTIONS: Therapeutic exercises, Therapeutic activity, Neuromuscular re-education, Balance training, Gait training, Patient/Family education, Joint manipulation, Joint mobilization, Stair training, Orthotic/Fit training, DME instructions, Aquatic Therapy, Dry Needling, Electrical stimulation, Spinal manipulation, Spinal mobilization, Cryotherapy, Moist heat, Compression bandaging, scar mobilization, Splintting, Taping, Traction, Ultrasound, Ionotophoresis 4mg /ml Dexamethasone, and Manual therapy   PLAN FOR NEXT SESSION: f/u with HEP and walking program, core and hip strength, progress as tolerated    7:57 AM, 07/24/21 Finn Amos PT, DPT

## 2021-07-26 ENCOUNTER — Encounter (HOSPITAL_COMMUNITY): Payer: BC Managed Care – PPO

## 2021-08-07 ENCOUNTER — Ambulatory Visit (INDEPENDENT_AMBULATORY_CARE_PROVIDER_SITE_OTHER): Payer: No Typology Code available for payment source | Admitting: Orthopedic Surgery

## 2021-08-07 DIAGNOSIS — M25561 Pain in right knee: Secondary | ICD-10-CM

## 2021-08-07 DIAGNOSIS — G8929 Other chronic pain: Secondary | ICD-10-CM | POA: Diagnosis not present

## 2021-08-07 NOTE — Progress Notes (Signed)
Chief Complaint  Patient presents with   Knee Pain    R/ it just hurts, weak and the pain is achy at the knee. Sometimes it feels like it is clicking behind knee cap.    66 year old female with chronic right knee pain primarily patellofemoral she feels like something is clicking and popping behind the kneecap.  She is on meloxicam and turmeric  Her exam shows crepitance and pain with patellofemoral stress testing normal range of motion no effusion  I suggested knee injection in the past but she declined she is okay to do it today  Right knee injection  Procedure note right knee injection   verbal consent was obtained to inject right knee joint  Timeout was completed to confirm the site of injection  The medications used were depomedrol 40 mg and 1% lidocaine 3 cc Anesthesia was provided by ethyl chloride and the skin was prepped with alcohol.  After cleaning the skin with alcohol a 20-gauge needle was used to inject the right knee joint. There were no complications. A sterile bandage was applied.   Encounter Diagnosis  Name Primary?   Chronic pain of right knee Yes

## 2021-09-07 ENCOUNTER — Ambulatory Visit (INDEPENDENT_AMBULATORY_CARE_PROVIDER_SITE_OTHER): Payer: No Typology Code available for payment source | Admitting: Orthopedic Surgery

## 2021-09-07 ENCOUNTER — Encounter: Payer: Self-pay | Admitting: Orthopedic Surgery

## 2021-09-07 DIAGNOSIS — M25561 Pain in right knee: Secondary | ICD-10-CM

## 2021-09-07 DIAGNOSIS — G8929 Other chronic pain: Secondary | ICD-10-CM

## 2021-09-07 DIAGNOSIS — M4306 Spondylolysis, lumbar region: Secondary | ICD-10-CM | POA: Diagnosis not present

## 2021-09-07 DIAGNOSIS — M4316 Spondylolisthesis, lumbar region: Secondary | ICD-10-CM | POA: Diagnosis not present

## 2021-09-07 DIAGNOSIS — M5441 Lumbago with sciatica, right side: Secondary | ICD-10-CM | POA: Diagnosis not present

## 2021-09-07 MED ORDER — PREDNISONE 10 MG (48) PO TBPK: ORAL_TABLET | Freq: Every day | ORAL | 0 refills | Status: DC

## 2021-09-07 MED ORDER — HYDROCODONE-ACETAMINOPHEN 10-325 MG PO TABS: 1.0000 | ORAL_TABLET | Freq: Three times a day (TID) | ORAL | 0 refills | Status: AC | PRN

## 2021-09-07 MED ORDER — DIAZEPAM 5 MG PO TABS: 5.0000 mg | ORAL_TABLET | Freq: Four times a day (QID) | ORAL | 0 refills | Status: DC | PRN

## 2021-09-07 NOTE — Patient Instructions (Addendum)
Rest/ raise legs when possible / knees bent

## 2021-09-07 NOTE — Progress Notes (Signed)
EVALUATION AND MANAGEMENT   Type of appointment : New problem and follow-up old problem  PLAN:   Right knee injection patient has made significant improvement and notes that as long as she takes meloxicam her right knee is fine  New problem acute back pain on chronic back pain  Recommend rest activity modification  Patient prefers ice over heat  Take the medications listed below  Meds ordered this encounter  Medications   diazepam (VALIUM) 5 MG tablet    Sig: Take 1 tablet (5 mg total) by mouth every 6 (six) hours as needed for up to 5 days for anxiety.    Dispense:  20 tablet    Refill:  0   predniSONE (STERAPRED UNI-PAK 48 TAB) 10 MG (48) TBPK tablet    Sig: Take by mouth daily. 10 mg ds 12 days as dir    Dispense:  48 tablet    Refill:  0   HYDROcodone-acetaminophen (NORCO) 10-325 MG tablet    Sig: Take 1 tablet by mouth every 8 (eight) hours as needed for up to 3 days.    Dispense:  9 tablet    Refill:  0     Chief Complaint  Patient presents with   Knee Pain    RT knee Knee feels much better    HPI 66 year old female with history of lower back pain spondylolysis previously treated with physical therapy presents now with acute onset of pain initially had radiculopathy which has subsequently resolved and is now only intermittent now pain localizes to right lower back with severe spasms pain sitting pain standing pain getting out of bed pain riding in the car  ROS  No bowel or bladder dysfunction There is no height or weight on file to calculate BMI.  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   Diverticulosis     History of shingles    Hypercholesteremia    Hyperlipidemia    Iron deficiency    Iron deficiency anemia 07/19/2010   Necrosis, breast fat    Osteopenia    Pars flaccida    lumbar back   PE (pulmonary embolism)    Pulled muscle may 2013   back   Pulmonary embolism ([place]) 07/19/2010   Tendonitis of elbow, right    History of   Past Surgical History:  Procedure Laterality Date   COLONOSCOPY N/A 11/05/2018   Procedure: COLONOSCOPY;  Surgeon: Rogene Houston, MD;  [facility];  Service: Endoscopy;  Laterality: N/A;  1030   EYE SURGERY     LAPAROSCOPIC NISSEN FUNDOPLICATION  2440   MASTECTOMY     right mastectomy with TRAM   MASTECTOMY W/ NODES PARTIAL     had right partial mastectomy with sentinel lymph node biopsy   POLYPECTOMY  11/05/2018   Procedure: POLYPECTOMY;  Surgeon: Rogene Houston, MD;  [facility];  Service: Endoscopy;;  colon   THROAT SURGERY     left vocal cord implants   TRAM     vocal cord surgery     left   Social History   Tobacco Use  Smoking status: Never   Smokeless tobacco: Never  Vaping Use   Vaping Use: Never used  Substance Use Topics   Alcohol use: No   Drug use: Never     Assessment and Plan:  Encounter Diagnoses  Name Primary?   Chronic pain of right knee    Spondylolysis, lumbar region    Spondylolisthesis, lumbar region    Acute right-sided low back pain with right-sided sciatica Yes   Follow-up 2-1/2 3 weeks

## 2021-09-21 ENCOUNTER — Ambulatory Visit (INDEPENDENT_AMBULATORY_CARE_PROVIDER_SITE_OTHER): Payer: No Typology Code available for payment source

## 2021-09-21 ENCOUNTER — Ambulatory Visit (INDEPENDENT_AMBULATORY_CARE_PROVIDER_SITE_OTHER): Payer: No Typology Code available for payment source | Admitting: Orthopedic Surgery

## 2021-09-21 VITALS — Ht 64.0 in | Wt 134.0 lb

## 2021-09-21 DIAGNOSIS — M4306 Spondylolysis, lumbar region: Secondary | ICD-10-CM | POA: Diagnosis not present

## 2021-09-21 MED ORDER — HYDROCODONE-ACETAMINOPHEN 5-325 MG PO TABS: 1.0000 | ORAL_TABLET | Freq: Three times a day (TID) | ORAL | 0 refills | Status: DC | PRN

## 2021-09-21 MED ORDER — METHOCARBAMOL 500 MG PO TABS: 500.0000 mg | ORAL_TABLET | Freq: Three times a day (TID) | ORAL | 1 refills | Status: DC

## 2021-09-21 MED ORDER — IBUPROFEN 800 MG PO TABS: 800.0000 mg | ORAL_TABLET | Freq: Three times a day (TID) | ORAL | 1 refills | Status: DC | PRN

## 2021-09-21 NOTE — Patient Instructions (Signed)
Warm heating pad  Epsom salt baths  Avoid activities that cause back pain  Take ibuprofen 3 times a day and hydrocodone as needed

## 2021-09-21 NOTE — Progress Notes (Addendum)
Chief Complaint  Patient presents with   Back Pain    Pt states pain is staying in her lower back but she's feeling better than she did 2 wks ago.    Patient with spondylolysis was put on diazepam prednisone and hydrocodone for acute onset of back pain without leg pain or any red flags  Patient has made some improvement with hydrocodone prednisone and Valium, however she is still hurting   Says she can probably come off the Valium and the prednisone pack is finished  We will get a continue with ibuprofen and some hydrocodone for severe pain  Recommend she continue to limit her activities  We will continue with some ibuprofen  She will come back in 4 weeks expect resolution if not we can proceed with new MRI    Meds ordered this encounter  Medications   ibuprofen (ADVIL) 800 MG tablet    Sig: Take 1 tablet (800 mg total) by mouth every 8 (eight) hours as needed.    Dispense:  90 tablet    Refill:  1   HYDROcodone-acetaminophen (NORCO/VICODIN) 5-325 MG tablet    Sig: Take 1 tablet by mouth every 8 (eight) hours as needed for moderate pain.    Dispense:  15 tablet    Refill:  0   (Chronic not stable, rx )

## 2021-09-21 NOTE — Addendum Note (Signed)
Addended by: Carole Civil on: 09/21/2021 04:39 PM   Modules accepted: Orders, Level of Service

## 2021-10-09 ENCOUNTER — Other Ambulatory Visit: Payer: Self-pay | Admitting: Orthopedic Surgery

## 2021-10-09 DIAGNOSIS — G8929 Other chronic pain: Secondary | ICD-10-CM

## 2021-10-19 ENCOUNTER — Encounter: Payer: Self-pay | Admitting: Orthopedic Surgery

## 2021-10-19 ENCOUNTER — Ambulatory Visit (INDEPENDENT_AMBULATORY_CARE_PROVIDER_SITE_OTHER): Payer: No Typology Code available for payment source | Admitting: Orthopedic Surgery

## 2021-10-19 VITALS — Ht 64.0 in | Wt 134.0 lb

## 2021-10-19 DIAGNOSIS — M5441 Lumbago with sciatica, right side: Secondary | ICD-10-CM | POA: Diagnosis not present

## 2021-10-19 DIAGNOSIS — M4316 Spondylolisthesis, lumbar region: Secondary | ICD-10-CM

## 2021-10-19 DIAGNOSIS — M4306 Spondylolysis, lumbar region: Secondary | ICD-10-CM

## 2021-10-19 MED ORDER — DIAZEPAM 5 MG PO TABS: 5.0000 mg | ORAL_TABLET | Freq: Four times a day (QID) | ORAL | 0 refills | Status: AC | PRN

## 2021-10-19 MED ORDER — PREDNISONE 10 MG (48) PO TBPK: ORAL_TABLET | Freq: Every day | ORAL | 0 refills | Status: DC

## 2021-10-19 NOTE — Patient Instructions (Signed)
While we are working on your approval for MRI please go ahead and call to schedule your appointment with  Imaging within at least one (1) week.   Central Scheduling [phone]  

## 2021-10-19 NOTE — Progress Notes (Signed)
Chief Complaint  Patient presents with   Back Pain    Pain/sensation has been going down to the foot after standing and walking for a long period of time on 9/16. Pt states she could barely move or get out of the bed. Today is the best she's felt since.    66 year old female chronic lower back pain intermittent symptoms of radicular symptoms in her left leg had a recurrent episode with weakness and inability to move her left leg comes in today with persistent left lower extremity radicular symptoms.  She has had multiple episodes of back pain and radicular symptoms which have responded well to diazepam for muscle spasms  Current Outpatient Medications  Medication Instructions   atorvastatin (LIPITOR) 10 mg, Oral, Daily at bedtime   Calcium Carbonate-Vit D-Min (CALTRATE 600+D PLUS MINERALS) 600-800 MG-UNIT CHEW 1 tablet, Oral, Continuous Dialysis   diazepam (VALIUM) 5 mg, Oral, Every 6 hours PRN   escitalopram (LEXAPRO) 5 mg, Oral, Daily   fluticasone (FLONASE) 50 MCG/ACT nasal spray 1 spray, Each Nare, 2 times daily   HYDROcodone-acetaminophen (NORCO/VICODIN) 5-325 MG tablet 1 tablet, Oral, Every 8 hours PRN   ibuprofen (ADVIL) 800 mg, Oral, Every 8 hours PRN   LORazepam (ATIVAN) 0.5 mg, Oral, Daily at bedtime   meloxicam (MOBIC) 7.5 MG tablet TAKE 1 TABLET DAILY IN THE EVENING   methocarbamol (ROBAXIN) 500 mg, Oral, 3 times daily   montelukast (SINGULAIR) 10 mg, Oral, Daily at bedtime   Multiple Vitamin (MULTIVITAMIN WITH MINERALS) TABS tablet 1 tablet, Oral, Daily   predniSONE (STERAPRED UNI-PAK 48 TAB) 10 MG (48) TBPK tablet Oral, Daily, 10 mg ds 12 days as dir   Turmeric 1,000 mg, Oral   Allergies  Allergen Reactions   Ethinyl Estradiol     Developed Pulmonary Embolus while on this med   Iodinated Contrast Media Swelling and Nausea Only   Iohexol      Desc: FACIAL SWELLING, STOMACH CRAMPS, PT NEEDS 13 HR PRE MEDICATION    Past Medical History:  Diagnosis Date   Breast cancer  ([place]) 2000   right   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  HENT:     Head: Normocephalic.  Eyes:     General: No scleral icterus. Skin:    General: Skin is warm and dry.     Capillary Refill: Capillary refill takes less than 2 seconds.  Neurological:     Mental Status: She is alert.     Sensory: Sensory deficit present.     Motor: No weakness.     Coordination: Coordination normal.     Gait: Gait abnormal.     Deep Tendon Reflexes: Reflexes normal.  Psychiatric:        Mood and Affect: Mood normal.        Behavior: Behavior normal.        Thought Content: Thought content normal.  Judgment: Judgment normal.    Gait: limping of to one side   Today she has a positive straight leg on the left no weakness in strength; reflexes normal and sensory loss in L5  Recommend MRI  Meds ordered this encounter  Medications   diazepam (VALIUM) 5 MG tablet    Sig: Take 1 tablet (5 mg total) by mouth every 6 (six) hours as needed for up to 3 days for anxiety.    Dispense:  12 tablet    Refill:  0

## 2021-10-24 DIAGNOSIS — M810 Age-related osteoporosis without current pathological fracture: Secondary | ICD-10-CM | POA: Diagnosis not present

## 2021-10-24 DIAGNOSIS — Z853 Personal history of malignant neoplasm of breast: Secondary | ICD-10-CM | POA: Diagnosis not present

## 2021-10-24 DIAGNOSIS — G4700 Insomnia, unspecified: Secondary | ICD-10-CM | POA: Diagnosis not present

## 2021-10-24 DIAGNOSIS — Z79899 Other long term (current) drug therapy: Secondary | ICD-10-CM | POA: Diagnosis not present

## 2021-10-24 DIAGNOSIS — Z6822 Body mass index (BMI) 22.0-22.9, adult: Secondary | ICD-10-CM | POA: Diagnosis not present

## 2021-10-24 DIAGNOSIS — M545 Low back pain, unspecified: Secondary | ICD-10-CM | POA: Diagnosis not present

## 2021-10-24 DIAGNOSIS — Z008 Encounter for other general examination: Secondary | ICD-10-CM | POA: Diagnosis not present

## 2021-10-24 DIAGNOSIS — E785 Hyperlipidemia, unspecified: Secondary | ICD-10-CM | POA: Diagnosis not present

## 2021-10-24 DIAGNOSIS — Z86010 Personal history of colonic polyps: Secondary | ICD-10-CM | POA: Diagnosis not present

## 2021-10-24 DIAGNOSIS — N182 Chronic kidney disease, stage 2 (mild): Secondary | ICD-10-CM | POA: Diagnosis not present

## 2021-11-03 ENCOUNTER — Ambulatory Visit (HOSPITAL_COMMUNITY)
Admission: RE | Admit: 2021-11-03 | Discharge: 2021-11-03 | Disposition: A | Discharge status: Home / Self Care | Payer: No Typology Code available for payment source | Source: Ambulatory Visit | Attending: Orthopedic Surgery | Admitting: Orthopedic Surgery

## 2021-11-03 DIAGNOSIS — M5416 Radiculopathy, lumbar region: Secondary | ICD-10-CM | POA: Diagnosis not present

## 2021-11-03 DIAGNOSIS — M5441 Lumbago with sciatica, right side: Secondary | ICD-10-CM | POA: Diagnosis present

## 2021-11-03 DIAGNOSIS — M4306 Spondylolysis, lumbar region: Secondary | ICD-10-CM | POA: Insufficient documentation

## 2021-11-03 DIAGNOSIS — M545 Low back pain, unspecified: Secondary | ICD-10-CM | POA: Diagnosis not present

## 2021-11-03 DIAGNOSIS — M4807 Spinal stenosis, lumbosacral region: Secondary | ICD-10-CM | POA: Diagnosis not present

## 2021-11-03 DIAGNOSIS — M4316 Spondylolisthesis, lumbar region: Secondary | ICD-10-CM | POA: Diagnosis present

## 2021-11-05 ENCOUNTER — Other Ambulatory Visit: Payer: Self-pay | Admitting: Orthopedic Surgery

## 2021-11-05 DIAGNOSIS — M4306 Spondylolysis, lumbar region: Secondary | ICD-10-CM

## 2021-11-05 DIAGNOSIS — G8929 Other chronic pain: Secondary | ICD-10-CM

## 2021-11-06 ENCOUNTER — Ambulatory Visit (INDEPENDENT_AMBULATORY_CARE_PROVIDER_SITE_OTHER): Payer: No Typology Code available for payment source | Admitting: Orthopedic Surgery

## 2021-11-06 DIAGNOSIS — M79604 Pain in right leg: Secondary | ICD-10-CM

## 2021-11-06 DIAGNOSIS — M4306 Spondylolysis, lumbar region: Secondary | ICD-10-CM

## 2021-11-06 DIAGNOSIS — M25561 Pain in right knee: Secondary | ICD-10-CM | POA: Diagnosis not present

## 2021-11-06 DIAGNOSIS — M545 Low back pain, unspecified: Secondary | ICD-10-CM | POA: Diagnosis not present

## 2021-11-06 DIAGNOSIS — M79605 Pain in left leg: Secondary | ICD-10-CM

## 2021-11-06 DIAGNOSIS — G8929 Other chronic pain: Secondary | ICD-10-CM | POA: Diagnosis not present

## 2021-11-06 DIAGNOSIS — M4316 Spondylolisthesis, lumbar region: Secondary | ICD-10-CM

## 2021-11-06 MED ORDER — MELOXICAM 7.5 MG PO TABS: ORAL_TABLET | ORAL | 3 refills | Status: DC

## 2021-11-06 NOTE — Progress Notes (Signed)
Chief Complaint  Patient presents with   Results    MRI -Lumbar spine    66 year old female who comes in for follow-up after MRI lumbar spine complaining of some mild discomfort in the lower back but improved overall from her most significant episode a few weeks back  I have looked at her MRI My interpretation: Her MRI shows that she has spondylolysis grade 2 spondylolisthesis foraminal stenosis at L4-5 bilaterally and then left-sided L5-S1   The report is noted below.  I told her that she needs to see a neurosurgeon as this is out of the scope of my practice and when she gets really bad she will need someone to see her acutely as I will not have any mechanisms to control her symptoms  She will start physical therapy She will continue with Tylenol She will continue with anti-inflammatory and muscle relaxers  Meds ordered this encounter  Medications   meloxicam (MOBIC) 7.5 MG tablet    Sig: TAKE 1 TABLET DAILY IN THE EVENING    Dispense:  90 tablet    Refill:  3    IMPRESSION: 1. 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 This may be even worse with flexion or extension. Flexion/extension radiographs could be used for further evaluation. 2. Moderate bilateral foraminal stenosis at L5-S1 is slightly worse on the right with significant progression from the prior study. 3. Mild left foraminal narrowing at L4-5. 4. Mild disc bulging and facet hypertrophy at L2-3 and L3-4 without significant stenosis.     Electronically Signed   By: San Morelle M.D.   On: 11/06/2021 06:20

## 2021-11-06 NOTE — Patient Instructions (Addendum)
Address: [address], [place], [place] [address] Phone: [phone] Dr Izora Ribas is the name of the Neurosurgeon with [facility], and his office will call you with appointment    Physical therapy has been ordered for you at [facility]. They should call you to schedule, [phone] is the phone number to call, if you want to call to schedule.

## 2021-11-07 ENCOUNTER — Encounter (HOSPITAL_COMMUNITY): Admission: RE | Payer: Self-pay | Source: Home / Self Care

## 2021-11-07 ENCOUNTER — Ambulatory Visit (HOSPITAL_COMMUNITY)
Admission: RE | Admit: 2021-11-07 | Payer: No Typology Code available for payment source | Source: Home / Self Care | Admitting: Orthopedic Surgery

## 2021-11-07 SURGERY — EXCISION MASS
Anesthesia: Regional | Laterality: Right

## 2021-11-15 ENCOUNTER — Ambulatory Visit
Admission: EM | Admit: 2021-11-15 | Discharge: 2021-11-15 | Disposition: A | Discharge status: Home / Self Care | Payer: No Typology Code available for payment source | Attending: Nurse Practitioner | Admitting: Nurse Practitioner

## 2021-11-15 ENCOUNTER — Ambulatory Visit (INDEPENDENT_AMBULATORY_CARE_PROVIDER_SITE_OTHER): Payer: No Typology Code available for payment source

## 2021-11-15 ENCOUNTER — Other Ambulatory Visit: Payer: Self-pay | Admitting: Orthopedic Surgery

## 2021-11-15 DIAGNOSIS — M4306 Spondylolysis, lumbar region: Secondary | ICD-10-CM

## 2021-11-15 DIAGNOSIS — S92354A Nondisplaced fracture of fifth metatarsal bone, right foot, initial encounter for closed fracture: Secondary | ICD-10-CM

## 2021-11-15 DIAGNOSIS — S92351A Displaced fracture of fifth metatarsal bone, right foot, initial encounter for closed fracture: Secondary | ICD-10-CM | POA: Diagnosis not present

## 2021-11-15 NOTE — ED Triage Notes (Signed)
Pt reports she fracture the right 5th metatarsal this morning after she twisted her ankle. Pt has not taken any meds for pain.

## 2021-11-15 NOTE — ED Provider Notes (Signed)
[facility]    CSN: [id number] Arrival date & time: 11/15/21  0850      History   Chief Complaint Chief Complaint  Patient presents with   Foot Pain    HPI Kaitlyn Morrison is a 66 y.o. female.   Patient presenting today with pain and swelling to the lateral portion of her right foot since this morning when she twisted her ankle.  States she heard a pop during the incident and has had pain and swelling, difficulty weightbearing since the incident.  Denies numbness, tingling, discoloration, pain elsewhere from the incident.  Has not yet taken anything for symptoms.  States she broke her left fifth metatarsal earlier this year and that her pain is similar and in the same spot on the right foot at this time so she thinks that she broke it again.    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   Diverticulosis    History of shingles    Hypercholesteremia    Hyperlipidemia    Iron deficiency    Iron deficiency anemia 07/19/2010   Necrosis, breast fat    Osteopenia    Pars flaccida    lumbar back   PE (pulmonary embolism)    Pulled muscle may 2013   back   Pulmonary embolism ([place]) 07/19/2010   Tendonitis of elbow, right    History of    Patient Active Problem List   Diagnosis Date Noted   Hx of colonic polyps 08/07/2018   Gastroesophageal reflux disease without esophagitis 01/02/2017   Carpal tunnel syndrome of right wrist 09/19/2016   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   Iron deficiency anemia 07/19/2010   Pulmonary embolism ([place]) 07/19/2010   Tendonitis of elbow, right    DERANGEMENT OF POSTERIOR HORN OF MEDIAL MENISCUS 02/27/2010   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   ILIOTIBIAL BAND SYNDROME, LEFT KNEE 02/27/2010    Past Surgical History:  Procedure Laterality Date   COLONOSCOPY N/A 11/05/2018   Procedure: COLONOSCOPY;  Surgeon: Rogene Houston, MD;  [facility];  Service: Endoscopy;  Laterality: N/A;  1030   EYE SURGERY     LAPAROSCOPIC NISSEN FUNDOPLICATION  3532   MASTECTOMY     right mastectomy with TRAM   MASTECTOMY W/ NODES PARTIAL     had right partial mastectomy with sentinel lymph node biopsy   POLYPECTOMY  11/05/2018   Procedure: POLYPECTOMY;  Surgeon: Rogene Houston, MD;  [facility];  Service: Endoscopy;;  colon   THROAT SURGERY     left vocal cord implants   TRAM     vocal cord surgery     left    OB History   No obstetric history on file.      Home Medications    Prior to Admission medications   Medication Sig Start Date End Date Taking? Authorizing Provider  atorvastatin (LIPITOR) 10 MG tablet Take 10 mg by mouth at bedtime.     [provider]  Calcium Carbonate-Vit D-Min (CALTRATE 600+D PLUS MINERALS) 600-800 MG-UNIT CHEW Chew 1 tablet by mouth continuous dialysis.     [provider]  escitalopram (LEXAPRO) 5 MG tablet Take 5 mg by mouth daily.    [provider]  fluticasone (FLONASE) 50 MCG/ACT nasal spray Place 1 spray into both nostrils 2 (two) times daily.  [provider]  LORazepam (ATIVAN) 0.5 MG tablet Take 0.5 mg by mouth at bedtime.  Patient not taking: Reported on 11/06/2021    [provider]  meloxicam (MOBIC) 7.5 MG tablet TAKE 1 TABLET DAILY IN THE EVENING 11/06/21   Carole Civil, MD  methocarbamol (ROBAXIN) 500 MG tablet Take 1 tablet (500 mg total) by mouth 3 (three) times daily. 09/21/21   Carole Civil, MD  montelukast (SINGULAIR) 10 MG tablet Take 10 mg by mouth at bedtime.     [provider]  Multiple Vitamin (MULTIVITAMIN WITH MINERALS) TABS tablet Take 1 tablet by mouth daily. Patient not taking: Reported on 11/06/2021    [provider]  Turmeric 500 MG CAPS Take 1,000 mg by mouth. Patient not taking: Reported on 11/06/2021    [provider]    Family  History Family History  Problem Relation Age of Onset   Cancer Other        family history    Diabetes Other        family history    Heart defect Other        family history    Dementia Mother    Breast cancer Maternal Aunt    Colon cancer Neg Hx     Social History Social History   Tobacco Use   Smoking status: Never   Smokeless tobacco: Never  Vaping Use   Vaping Use: Never used  Substance Use Topics   Alcohol use: No   Drug use: Never     Allergies   Ethinyl estradiol, Iodinated contrast media, and Iohexol   Review of Systems Review of Systems Per HPI  Physical Exam Triage Vital Signs ED Triage Vitals  Enc Vitals Group     BP 11/15/21 1006 114/66     Pulse Rate 11/15/21 1006 71     Resp 11/15/21 1006 16     Temp 11/15/21 1006 98.6 F (37 C)     Temp Source 11/15/21 1006 Oral     SpO2 11/15/21 1006 98 %     Weight --      Height --      Head Circumference --      Peak Flow --      Pain Score 11/15/21 1005 5     Pain Loc --      Pain Edu? --      Excl. in [place]? --    No data found.  Updated Vital Signs BP 114/66 (BP Location: Left Arm)   Pulse 71   Temp 98.6 F (37 C) (Oral)   Resp 16   SpO2 98%   Visual Acuity Right Eye Distance:   Left Eye Distance:   Bilateral Distance:    Right Eye Near:   Left Eye Near:    Bilateral Near:     Physical Exam Vitals and nursing note reviewed.  Constitutional:      Appearance: Normal appearance. She is not ill-appearing.  HENT:     Head: Atraumatic.     Mouth/Throat:     Mouth: Mucous membranes are moist.  Eyes:     Extraocular Movements: Extraocular movements intact.     Conjunctiva/sclera: Conjunctivae normal.  Cardiovascular:     Rate and Rhythm: Normal rate and regular rhythm.     Heart sounds: Normal heart sounds.  Pulmonary:     Effort: Pulmonary effort is normal.     Breath sounds: Normal breath sounds.  Musculoskeletal:  General: Swelling, tenderness and signs of injury  present. No deformity.     Cervical back: Normal range of motion and neck supple.     Comments: Unable to bear weight on the right foot due to pain to the lateral aspect of the foot.  Focal area of tenderness to palpation over the mid fifth metatarsal  Skin:    General: Skin is warm and dry.     Findings: No bruising.  Neurological:     Mental Status: She is alert.     Comments: Right lower extremity neurovascularly intact  Psychiatric:        Mood and Affect: Mood normal.        Thought Content: Thought content normal.        Judgment: Judgment normal.      UC Treatments / Results  Labs (all labs ordered are listed, but only abnormal results are displayed) Labs Reviewed - No data to display  EKG   Radiology DG Foot Complete Right  Result Date: 11/15/2021 CLINICAL DATA:  Lateral foot pain after rolling injury EXAM: RIGHT FOOT COMPLETE - 3+ VIEW COMPARISON:  None Available. FINDINGS: Nondisplaced fracture through the proximal fifth metatarsal, Jones type location. Subjective osteopenia. Bipartite great toe sesamoids. IMPRESSION: Nondisplaced proximal fifth metatarsal fracture. Electronically Signed   By: Jorje Guild M.D.   On: 11/15/2021 10:21    Procedures Procedures (including critical care time)  Medications Ordered in UC Medications - No data to display  Initial Impression / Assessment and Plan / UC Course  I have reviewed the triage vital signs and the nursing notes.  Pertinent labs & imaging results that were available during my care of the patient were reviewed by me and considered in my medical decision making (see chart for details).     X-ray today revealing a nondisplaced fifth metatarsal fracture of the right foot.  We will place in a cam boot, discussed over-the-counter care and states she already has an orthopedist from the other foot fracture earlier this year so we will follow-up with them next week.  Return for worsening symptoms.  Final Clinical  Impressions(s) / UC Diagnoses   Final diagnoses:  Closed nondisplaced fracture of fifth metatarsal bone of right foot, initial encounter   Discharge Instructions   None    ED Prescriptions   None    PDMP not reviewed this encounter.   Volney American, [place] 11/15/21 1116

## 2021-11-20 ENCOUNTER — Ambulatory Visit: Payer: No Typology Code available for payment source | Admitting: Orthopedic Surgery

## 2021-11-20 DIAGNOSIS — S92354A Nondisplaced fracture of fifth metatarsal bone, right foot, initial encounter for closed fracture: Secondary | ICD-10-CM

## 2021-11-20 NOTE — Progress Notes (Signed)
Chief Complaint  Patient presents with   Foot Injury    Right- DOI 11/15/21- was coming out the door spilt coffee kinda moved quickly and heard it pop    HPI: [name] had previous fracture left foot similar type injury but on this occasion moved quickly and felt a pop in the right foot x-rays were done at urgent care pain over the fifth metatarsal patient placed in a boot patient on vitamin D patient with history of osteoporosis on IV osteoporosis drug with improved bone density test this year  Complains of pain right foot  Past Medical History:  Diagnosis Date   Breast cancer ([place]) 2000   right   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   Diverticulosis    History of shingles    Hypercholesteremia    Hyperlipidemia    Iron deficiency    Iron deficiency anemia 07/19/2010   Necrosis, breast fat    Osteopenia    Pars flaccida    lumbar back   PE (pulmonary embolism)    Pulled muscle may 2013   back   Pulmonary embolism ([place]) 07/19/2010   Tendonitis of elbow, right    History of    General appearance: Well-developed well-nourished no gross deformities  Cardiovascular normal pulse and perfusion normal color without edema  Neurologically no sensation loss or deficits or pathologic reflexes  Psychological: Awake alert and oriented x3 mood and affect normal  Skin no lacerations or ulcerations no nodularity no palpable masses, no erythema or nodularity  Musculoskeletal: Tenderness over the fifth metatarsal foot alignment otherwise normal  Imaging at [place] nondisplaced fracture Jones region probably a stress fracture  A/P  5 weeks xrays right foot   Use the scooter and D3 wear the boot as well does not have to wear it from the scooter

## 2021-11-20 NOTE — Patient Instructions (Addendum)
Scooter x 5 weeks then x-Rays   500iu vit D3

## 2021-11-21 NOTE — Progress Notes (Unsigned)
Referring Physician:  Carole Civil, MD [address] [place],  [place] [address]  Primary Physician:  Celene Squibb, MD  History of Present Illness: 11/23/2021 Kaitlyn Morrison is here today with a chief complaint of low back pain that radiates into the left leg and stops at the ankle along with weakness. She also has pain in the right posterior thigh but her left side is more bothersome.  She has had pain for many years but it is been worsening over the past several months.  She gets pain as bad as 10 out of 10 and made worse by standing, walking, lifting, or bending over.  She has to change positions often.  She takes medication to help with her pain.  She has tried physical therapy more than once without improvement.   Bowel/Bladder Dysfunction: none  Conservative measures:  Physical therapy: has participated in from 06/15/21 to 07/24/21 and was discharged. Will start another round of physical therapy November 8th.  Multimodal medical therapy including regular antiinflammatories: meloxicam, tylenol, methocarbamol Injections: has not received any epidural steroid injections  Past Surgery: denies  Kaitlyn Morrison has no symptoms of cervical myelopathy.  The symptoms are causing a significant impact on the patient's life.   Review of Systems:  A 10 point review of systems is negative, except for the pertinent positives and negatives detailed in the HPI.  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   Diverticulosis    History of shingles    Hypercholesteremia    Hyperlipidemia    Iron deficiency    Iron deficiency anemia 07/19/2010   Necrosis, breast fat    Osteopenia    Pars flaccida    lumbar back   PE (pulmonary embolism)    Pulled muscle may 2013   back   Pulmonary embolism ([place]) 07/19/2010   Tendonitis of elbow, right    History of     Past Surgical History: Past Surgical History:  Procedure Laterality Date   COLONOSCOPY N/A 11/05/2018   Procedure: COLONOSCOPY;  Surgeon: Rogene Houston, MD;  [facility];  Service: Endoscopy;  Laterality: N/A;  1030   EYE SURGERY     LAPAROSCOPIC NISSEN FUNDOPLICATION  1914   MASTECTOMY     right mastectomy with TRAM   MASTECTOMY W/ NODES PARTIAL     had right partial mastectomy with sentinel lymph node biopsy   POLYPECTOMY  11/05/2018   Procedure: POLYPECTOMY;  Surgeon: Rogene Houston, MD;  [facility];  Service: Endoscopy;;  colon   THROAT SURGERY     left vocal cord implants   TRAM     vocal cord surgery     left    Allergies: Allergies as of 11/23/2021 - Review Complete 11/23/2021  Allergen Reaction Noted   Ethinyl estradiol  07/12/2010   Iodinated contrast media Swelling and Nausea Only 07/12/2010   Iohexol  03/14/2007    Medications: Current Meds  Medication Sig   atorvastatin (LIPITOR) 10 MG tablet Take 10 mg by mouth at bedtime.    Calcium Carbonate-Vit D-Min (CALTRATE 600+D PLUS MINERALS) 600-800 MG-UNIT CHEW Chew 1 tablet by mouth continuous dialysis.    cholecalciferol (VITAMIN D3) 25 MCG (1000 UNIT) tablet Take 5,000 Units by mouth daily.   fluticasone (FLONASE) 50 MCG/ACT nasal spray Place 1 spray into both nostrils  2 (two) times daily.   LORazepam (ATIVAN) 0.5 MG tablet Take 0.5 mg by mouth at bedtime.   meloxicam (MOBIC) 7.5 MG tablet TAKE 1 TABLET DAILY IN THE EVENING   methocarbamol (ROBAXIN) 500 MG tablet TAKE 1 TABLET BY MOUTH THREE TIMES A DAY   montelukast (SINGULAIR) 10 MG tablet Take 10 mg by mouth at bedtime.    Multiple Vitamin (MULTIVITAMIN WITH MINERALS) TABS tablet Take 1 tablet by mouth daily.   Turmeric 500 MG CAPS Take 1,000 mg by mouth.    Social History: Social History   Tobacco Use   Smoking status: Never   Smokeless tobacco: Never  Vaping Use   Vaping Use: Never used  Substance Use Topics   Alcohol  use: No   Drug use: Never    Family Medical History: Family History  Problem Relation Age of Onset   Cancer Other        family history    Diabetes Other        family history    Heart defect Other        family history    Dementia Mother    Breast cancer Maternal Aunt    Colon cancer Neg Hx     Physical Examination: Vitals:   11/23/21 0913  BP: 124/76    General: Patient is well developed, well nourished, calm, collected, and in no apparent distress. Attention to examination is appropriate.  Neck:   Supple.  Full range of motion.  Respiratory: Patient is breathing without any difficulty.   NEUROLOGICAL:     Awake, alert, oriented to person, place, and time.  Speech is clear and fluent. Fund of knowledge is appropriate.   Cranial Nerves: Pupils equal round and reactive to light.  Facial tone is symmetric.  Facial sensation is symmetric. Shoulder shrug is symmetric. Tongue protrusion is midline.  There is no pronator drift.  ROM of spine: full.    Strength: Side Biceps Triceps Deltoid Interossei Grip Wrist Ext. Wrist Flex.  R '5 5 5 5 5 5 5  '$ L '5 5 5 5 5 5 5   '$ Side Iliopsoas Quads Hamstring PF DF EHL  R '5 5 5 '$ - - -  L '5 5 5 5 5 5   '$ Reflexes are 1+ and symmetric at the biceps, triceps, brachioradialis, patella and achilles (L only).   Hoffman's is absent.   Bilateral upper and lower extremity sensation is intact to light touch.    No evidence of dysmetria noted.  Gait is abnormal due to right fifth metatarsal fracture.     Medical Decision Making  Imaging: MRI L spine 11/06/2021   IMPRESSION: 1. 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 This may be even worse with flexion or extension. Flexion/extension radiographs could be used for further evaluation. 2. Moderate bilateral foraminal stenosis at L5-S1 is slightly worse on the right with significant progression from the prior study. 3. Mild left foraminal narrowing at  L4-5. 4. Mild disc bulging and facet hypertrophy at L2-3 and L3-4 without significant stenosis.     Electronically Signed   By: San Morelle M.D.   On: 11/06/2021 06:20  I have personally reviewed the images and agree with the above interpretation.  Assessment and Plan: Kaitlyn Morrison is a pleasant 66 y.o. 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  On her CT scan of her abdomen in 2009, she only had a 3 mm anterolisthesis.  This is now  11 mm on MRI scan.  On her standing x-rays, it increases to 13.5 mm.  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  She has moderate to severe compression of her nerve roots in the foramen as well.  She has tried and failed physical therapy.  There is no role for further conservative management given her worsening pain and worsening radiographic findings.  She has implied instability due to the change in anterolisthesis over time.  I recommend surgical intervention.  I think transforaminal lumbar interbody fusion and anterior lumbar interbody fusion could be considered.  I would recommend anterior lumbar interbody fusion followed by percutaneous fixation and fusion at L5-S1 with assistance of vascular surgery.  This would allow for reestablishment of normal lumbar lordosis.  She would like to think about this.  She will let us know if she would like to move forward.  I discussed the planned procedure at length with the patient, including the risks, benefits, alternatives, and indications. The risks discussed include but are not limited to bleeding, infection, need for reoperation, spinal fluid leak, stroke, vision loss, anesthetic complication, coma, paralysis, and even death. I also described in detail that improvement was not guaranteed.  The patient expressed understanding of these risks, and asked that we proceed with surgery. I described the surgery in layman's terms, and gave ample opportunity for  questions, which were answered to the best of my ability.     I spent a total of 30 minutes in face-to-face and non-face-to-face activities related to this patient's care today.  Thank you for involving me in the care of this patient.      Lashanda Storlie K. Izora Ribas MD, [facility]

## 2021-11-23 ENCOUNTER — Encounter: Payer: Self-pay | Admitting: Neurosurgery

## 2021-11-23 ENCOUNTER — Ambulatory Visit (INDEPENDENT_AMBULATORY_CARE_PROVIDER_SITE_OTHER): Payer: No Typology Code available for payment source | Admitting: Neurosurgery

## 2021-11-23 VITALS — BP 124/76 | Ht 64.0 in | Wt 140.6 lb

## 2021-11-23 DIAGNOSIS — G8929 Other chronic pain: Secondary | ICD-10-CM

## 2021-11-23 DIAGNOSIS — M4306 Spondylolysis, lumbar region: Secondary | ICD-10-CM

## 2021-11-23 DIAGNOSIS — M5441 Lumbago with sciatica, right side: Secondary | ICD-10-CM | POA: Diagnosis not present

## 2021-11-23 DIAGNOSIS — M5442 Lumbago with sciatica, left side: Secondary | ICD-10-CM | POA: Diagnosis not present

## 2021-11-23 DIAGNOSIS — M4317 Spondylolisthesis, lumbosacral region: Secondary | ICD-10-CM | POA: Diagnosis not present

## 2021-11-29 ENCOUNTER — Other Ambulatory Visit (HOSPITAL_COMMUNITY): Payer: Self-pay

## 2021-11-29 ENCOUNTER — Other Ambulatory Visit: Payer: Self-pay

## 2021-11-29 DIAGNOSIS — M810 Age-related osteoporosis without current pathological fracture: Secondary | ICD-10-CM

## 2021-11-30 ENCOUNTER — Telehealth: Payer: Self-pay | Admitting: Pharmacy Technician

## 2021-11-30 NOTE — Telephone Encounter (Addendum)
Auth Submission: approved Payer: [employer] Medication & CPT/J Code(s) submitted: Prolia (Denosumab) [id number] Route of submission (phone, fax, portal):  Phone # [phone] Fax # [phone] Auth type: Buy/Bill Units/visits requested: '60MG'$  [id number] Reference number: [id number] Approval from: 11/30/21 to 12/01/22  at [place] co-pay card: pending

## 2021-12-01 ENCOUNTER — Other Ambulatory Visit: Payer: Self-pay

## 2021-12-05 NOTE — Therapy (Incomplete)
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   Diverticulosis    History of shingles    Hypercholesteremia    Hyperlipidemia    Iron deficiency    Iron deficiency anemia 07/19/2010   Necrosis, breast fat    Osteopenia    Pars flaccida    lumbar back   PE (pulmonary embolism)    Pulled muscle may 2013   back   Pulmonary embolism ([place]) 07/19/2010   Tendonitis of elbow, right    History of   Past Surgical History:  Procedure Laterality Date   COLONOSCOPY N/A 11/05/2018   Procedure: COLONOSCOPY;  Surgeon: Rogene Houston, MD;  [facility];  Service: Endoscopy;  Laterality: N/A;  52   EYE SURGERY     LAPAROSCOPIC NISSEN FUNDOPLICATION  4097   MASTECTOMY     right mastectomy with TRAM   MASTECTOMY W/ NODES PARTIAL     had right partial mastectomy with sentinel lymph node biopsy   POLYPECTOMY  11/05/2018   Procedure: POLYPECTOMY;  Surgeon: Rogene Houston, MD;  [facility]: [facility];  Service: Endoscopy;;  colon   THROAT SURGERY     left vocal cord implants   TRAM     vocal cord surgery     left   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  ONSET DATE: ***  SUBJECTIVE:                                                                                                                                                                                           SUBJECTIVE STATEMENT: ***  PERTINENT HISTORY:  ***  PAIN:  Are you having pain? {OPRCPAIN:27236}  PRECAUTIONS: {Therapy precautions:24002}  WEIGHT BEARING RESTRICTIONS: {  Yes ***/No:24003}  FALLS:  Has patient fallen in last 6 months? {fallsyesno:27318}  LIVING ENVIRONMENT: Lives with: {OPRC lives with:25569::"lives with their family"} Lives in: {Lives in:25570} Stairs: {opstairs:27293} Has following equipment at home: {Assistive devices:23999}  OCCUPATION: ***  PLOF: {PLOF:24004}  PATIENT GOALS: ***  NEXT MD VISIT:   OBJECTIVE:   DIAGNOSTIC FINDINGS:  ***  PATIENT SURVEYS:  {rehab surveys:24030}  SCREENING FOR RED FLAGS: Bowel or bladder incontinence: {Yes/No:304960894} Spinal tumors: {Yes/No:304960894} Cauda equina syndrome: {Yes/No:304960894} Compression fracture: {Yes/No:304960894} Abdominal aneurysm: {Yes/No:304960894}  COGNITION: Overall cognitive status: {cognition:24006}     SENSATION: {sensation:27233}  MUSCLE LENGTH: Hamstrings: Right *** deg; Left *** deg Thomas test: Right *** deg; Left *** deg  POSTURE: {posture:25561}  PALPATION: ***  LUMBAR ROM:   AROM eval  Flexion   Extension   Right lateral flexion   Left lateral flexion   Right rotation   Left rotation    (Blank rows = not tested)  LOWER EXTREMITY ROM:     {AROM/PROM:27142}  Right eval Left eval  Hip flexion    Hip extension    Hip abduction    Hip adduction    Hip internal rotation    Hip external rotation    Knee flexion    Knee extension    Ankle dorsiflexion    Ankle plantarflexion    Ankle inversion    Ankle eversion     (Blank rows = not tested)  LOWER  EXTREMITY MMT:    MMT Right eval Left eval  Hip flexion    Hip extension    Hip abduction    Hip adduction    Hip internal rotation    Hip external rotation    Knee flexion    Knee extension    Ankle dorsiflexion    Ankle plantarflexion    Ankle inversion    Ankle eversion     (Blank rows = not tested)  LUMBAR SPECIAL TESTS:  {lumbar special test:25242}  FUNCTIONAL TESTS:  {Functional tests:24029}  GAIT: Distance walked: *** Assistive device utilized: {Assistive devices:23999} Level of assistance: {Levels of assistance:24026} Comments: ***  TODAY'S TREATMENT:                                                                                                                              DATE: ***    PATIENT EDUCATION:  Education details: *** Person educated: {Person educated:25204} Education method: {Education Method:25205} Education comprehension: {Education Comprehension:25206}  HOME EXERCISE PROGRAM: ***  ASSESSMENT:  CLINICAL IMPRESSION: Patient is a *** y.o. *** who was seen today for physical therapy evaluation and treatment for ***.   OBJECTIVE IMPAIRMENTS: {opptimpairments:25111}.   ACTIVITY LIMITATIONS: {activitylimitations:27494}  PARTICIPATION LIMITATIONS: {participationrestrictions:25113}  PERSONAL FACTORS: {Personal factors:25162} are also affecting patient's functional outcome.   REHAB POTENTIAL: {rehabpotential:25112}  CLINICAL DECISION MAKING: {clinical decision making:25114}  EVALUATION COMPLEXITY: {Evaluation complexity:25115}   GOALS: Goals reviewed with patient? {yes/no:20286}  SHORT TERM GOALS: Target date: {follow up:25551}  *** Baseline:  Goal status: {GOALSTATUS:25110}  2.  *** Baseline:  Goal status: {GOALSTATUS:25110}  3.  *** Baseline:  Goal status: {GOALSTATUS:25110}  4.  *** Baseline:  Goal status: {GOALSTATUS:25110}  5.  *** Baseline:  Goal status: {GOALSTATUS:25110}  6.  *** Baseline:  Goal status:  {GOALSTATUS:25110}  LONG TERM GOALS: Target date: {follow up:25551}  *** Baseline:  Goal status: {GOALSTATUS:25110}  2.  *** Baseline:  Goal status: {GOALSTATUS:25110}  3.  *** Baseline:  Goal status: {GOALSTATUS:25110}  4.  *** Baseline:  Goal status: {GOALSTATUS:25110}  5.  *** Baseline:  Goal status: {GOALSTATUS:25110}  6.  *** Baseline:  Goal status: {GOALSTATUS:25110}  PLAN:  PT FREQUENCY: {rehab frequency:25116}  PT DURATION: {rehab duration:25117}  PLANNED INTERVENTIONS: {rehab planned interventions:25118::"Therapeutic exercises","Therapeutic activity","Neuromuscular re-education","Balance training","Gait training","Patient/Family education","Self Care","Joint mobilization"}.  PLAN FOR NEXT SESSION: ***   Ardis Rowan, PT 12/05/2021, 7:02 AM

## 2021-12-06 ENCOUNTER — Telehealth: Payer: Self-pay | Admitting: Orthopedic Surgery

## 2021-12-06 ENCOUNTER — Ambulatory Visit (HOSPITAL_COMMUNITY): Payer: No Typology Code available for payment source

## 2021-12-06 DIAGNOSIS — M810 Age-related osteoporosis without current pathological fracture: Secondary | ICD-10-CM | POA: Diagnosis not present

## 2021-12-06 DIAGNOSIS — E782 Mixed hyperlipidemia: Secondary | ICD-10-CM | POA: Diagnosis not present

## 2021-12-06 NOTE — Telephone Encounter (Signed)
The patient presented to the office requesting that Dr. Aline Brochure review her notes from Dr. Izora Ribas regarding her back and advise if he thinks an injection is the way to go or not.  Pt's # [phone].

## 2021-12-11 NOTE — Telephone Encounter (Signed)
Ok

## 2021-12-12 DIAGNOSIS — M5441 Lumbago with sciatica, right side: Secondary | ICD-10-CM | POA: Diagnosis not present

## 2021-12-12 DIAGNOSIS — Z23 Encounter for immunization: Secondary | ICD-10-CM | POA: Diagnosis not present

## 2021-12-12 DIAGNOSIS — J302 Other seasonal allergic rhinitis: Secondary | ICD-10-CM | POA: Diagnosis not present

## 2021-12-12 DIAGNOSIS — M25561 Pain in right knee: Secondary | ICD-10-CM | POA: Insufficient documentation

## 2021-12-12 DIAGNOSIS — M189 Osteoarthritis of first carpometacarpal joint, unspecified: Secondary | ICD-10-CM | POA: Diagnosis not present

## 2021-12-12 DIAGNOSIS — M4317 Spondylolisthesis, lumbosacral region: Secondary | ICD-10-CM | POA: Insufficient documentation

## 2021-12-12 DIAGNOSIS — F329 Major depressive disorder, single episode, unspecified: Secondary | ICD-10-CM | POA: Diagnosis not present

## 2021-12-12 DIAGNOSIS — M810 Age-related osteoporosis without current pathological fracture: Secondary | ICD-10-CM | POA: Diagnosis not present

## 2021-12-12 DIAGNOSIS — Z86010 Personal history of colonic polyps: Secondary | ICD-10-CM | POA: Diagnosis not present

## 2021-12-12 DIAGNOSIS — G4700 Insomnia, unspecified: Secondary | ICD-10-CM | POA: Diagnosis not present

## 2021-12-12 DIAGNOSIS — Z853 Personal history of malignant neoplasm of breast: Secondary | ICD-10-CM | POA: Diagnosis not present

## 2021-12-12 DIAGNOSIS — E782 Mixed hyperlipidemia: Secondary | ICD-10-CM | POA: Diagnosis not present

## 2021-12-25 ENCOUNTER — Ambulatory Visit (INDEPENDENT_AMBULATORY_CARE_PROVIDER_SITE_OTHER): Payer: No Typology Code available for payment source | Admitting: Orthopedic Surgery

## 2021-12-25 ENCOUNTER — Ambulatory Visit (INDEPENDENT_AMBULATORY_CARE_PROVIDER_SITE_OTHER): Payer: No Typology Code available for payment source

## 2021-12-25 DIAGNOSIS — S92354D Nondisplaced fracture of fifth metatarsal bone, right foot, subsequent encounter for fracture with routine healing: Secondary | ICD-10-CM

## 2021-12-25 DIAGNOSIS — S92354A Nondisplaced fracture of fifth metatarsal bone, right foot, initial encounter for closed fracture: Secondary | ICD-10-CM | POA: Diagnosis not present

## 2021-12-25 NOTE — Patient Instructions (Signed)
WBAT IN BOOT

## 2021-12-25 NOTE — Progress Notes (Signed)
Chief Complaint  Patient presents with   Fracture care right fifth metatarsal   Encounter Diagnosis  Name Primary?   Closed nondisplaced fracture of fifth metatarsal bone of right foot with routine healing, subsequent encounter Yes    Date of injury November 15, 2021  Fracture care follow-up fifth metatarsal right foot  HPI: Kaitlyn Morrison had previous fracture left foot similar type injury but on this occasion moved quickly and felt a pop in the right foot x-rays were done at urgent care pain over the fifth metatarsal patient placed in a boot patient on vitamin D patient with history of osteoporosis on IV osteoporosis drug with improved bone density test this year   Complains of pain right foot Scooter x 5 weeks then x-Rays    5000iu vit D3  Today  Patient is improving in terms of pain she still tender at the fracture site x-ray shows the fracture is slightly resolving  Patient will continue with weightbearing in the cam walker and take the vitamin D3 and x-ray again in 6 weeks

## 2022-01-02 DIAGNOSIS — M810 Age-related osteoporosis without current pathological fracture: Secondary | ICD-10-CM | POA: Diagnosis not present

## 2022-01-23 ENCOUNTER — Other Ambulatory Visit: Payer: Self-pay | Admitting: Orthopedic Surgery

## 2022-01-23 DIAGNOSIS — M545 Low back pain, unspecified: Secondary | ICD-10-CM

## 2022-01-23 NOTE — Progress Notes (Signed)
Back spasm with leg pain

## 2022-01-24 ENCOUNTER — Other Ambulatory Visit: Payer: Self-pay | Admitting: Orthopedic Surgery

## 2022-01-24 MED ORDER — DIAZEPAM 5 MG PO TABS: 5.0000 mg | ORAL_TABLET | Freq: Four times a day (QID) | ORAL | 0 refills | Status: DC | PRN

## 2022-01-24 NOTE — Progress Notes (Signed)
Meds ordered this encounter  Medications   diazepam (VALIUM) 5 MG tablet    Sig: Take 1 tablet (5 mg total) by mouth every 6 (six) hours as needed for anxiety.    Dispense:  12 tablet    Refill:  0

## 2022-02-05 ENCOUNTER — Ambulatory Visit (INDEPENDENT_AMBULATORY_CARE_PROVIDER_SITE_OTHER): Payer: No Typology Code available for payment source | Admitting: Orthopedic Surgery

## 2022-02-05 ENCOUNTER — Ambulatory Visit (INDEPENDENT_AMBULATORY_CARE_PROVIDER_SITE_OTHER): Payer: No Typology Code available for payment source

## 2022-02-05 ENCOUNTER — Encounter: Payer: Self-pay | Admitting: Orthopedic Surgery

## 2022-02-05 ENCOUNTER — Telehealth: Payer: Self-pay | Admitting: Radiology

## 2022-02-05 DIAGNOSIS — S92354D Nondisplaced fracture of fifth metatarsal bone, right foot, subsequent encounter for fracture with routine healing: Secondary | ICD-10-CM

## 2022-02-05 DIAGNOSIS — S92351G Displaced fracture of fifth metatarsal bone, right foot, subsequent encounter for fracture with delayed healing: Secondary | ICD-10-CM

## 2022-02-05 NOTE — Telephone Encounter (Signed)
Have asked Dr Aline Brochure if she has delayed union or non union so I can send orders to [facility], Dr Aline Brochure will let me know.

## 2022-02-05 NOTE — Telephone Encounter (Signed)
Sent to [place] with [facility]

## 2022-02-05 NOTE — Telephone Encounter (Signed)
-----   Message from Carole Civil, MD sent at 02/05/2022  9:09 AM EST ----- Order bone stimulator

## 2022-02-05 NOTE — Telephone Encounter (Signed)
Delayed union  Will send to [facility]

## 2022-02-05 NOTE — Progress Notes (Signed)
Chief Complaint  Patient presents with   Foot Pain    Right 11/15/21   Encounter Diagnosis  Name Primary?   Closed displaced fracture of fifth metatarsal bone of right foot with delayed healing 11/15/21 Yes    Kaitlyn Morrison is having some discomfort over the fracture site.  She is tender over the fracture site.  The x-ray does not show any progression towards healing at 82 days post injury  Recommend she can increase her vitamin D, use her scooter more to assist with nonweightbearing  Add bone stimulator if possible  Return in 6 weeks for x-ray

## 2022-02-06 ENCOUNTER — Telehealth: Payer: Self-pay | Admitting: Radiology

## 2022-02-06 NOTE — Telephone Encounter (Signed)
Medicare will not cover the bone stimulator until 90 days after the injury We will need to see her at 91 days post injury Repeat xrays and at that point would be considered a non union when they would pick up coverage of the device  I will call her and make appointment for next week if you are agreeable to this plan

## 2022-02-06 NOTE — Telephone Encounter (Signed)
I called her and she agrees, I let Grayland Ormond know plan

## 2022-02-17 DIAGNOSIS — U071 COVID-19: Secondary | ICD-10-CM | POA: Diagnosis not present

## 2022-02-27 ENCOUNTER — Telehealth: Payer: Self-pay | Admitting: Radiology

## 2022-02-27 DIAGNOSIS — M79604 Pain in right leg: Secondary | ICD-10-CM

## 2022-02-27 NOTE — Telephone Encounter (Signed)
Yes I will call her

## 2022-02-27 NOTE — Telephone Encounter (Signed)
-----  Message from Carole Civil, MD sent at 02/26/2022  8:13 AM EST ----- Can she be sent for esi without new appt already had recent mri

## 2022-02-28 NOTE — Telephone Encounter (Signed)
Put in orders and advised patient of the number so she will recognize it.

## 2022-03-06 ENCOUNTER — Encounter: Payer: Self-pay | Admitting: Physical Medicine and Rehabilitation

## 2022-03-06 ENCOUNTER — Ambulatory Visit: Payer: No Typology Code available for payment source | Admitting: Physical Medicine and Rehabilitation

## 2022-03-06 DIAGNOSIS — M4316 Spondylolisthesis, lumbar region: Secondary | ICD-10-CM | POA: Diagnosis not present

## 2022-03-06 DIAGNOSIS — M4306 Spondylolysis, lumbar region: Secondary | ICD-10-CM

## 2022-03-06 DIAGNOSIS — M4726 Other spondylosis with radiculopathy, lumbar region: Secondary | ICD-10-CM | POA: Diagnosis not present

## 2022-03-06 DIAGNOSIS — M5416 Radiculopathy, lumbar region: Secondary | ICD-10-CM

## 2022-03-06 NOTE — Progress Notes (Signed)
Kaitlyn Morrison - 67 y.o. female MRN [id number]  Date of birth: [birth year]  Office Visit Note: Visit Date: 03/06/2022 PCP: Celene Squibb, MD Referred by: Celene Squibb, MD  Subjective: Chief Complaint  Patient presents with   Lower Back - Pain   HPI: Kaitlyn Morrison is a 67 y.o. female who comes in today per the request of Dr. Arther Abbott for evaluation of chronic, worsening and severe bilateral lower back pain radiating down posterior legs to ankles, left greater then right. Pain ongoing for several years, worsened over the last year and a half. She describes her pain as a sore and aching sensation, predominately to her legs, currently rates as 8 out of 10. Pain worsens with movement, activity and prolonged sitting. She has to switch from sitting to standing frequently due to pain.  Some relief of pain with home exercise regimen, rest and use of medications.  Patient does take Robaxin, Meloxicam and Valium. States she has not been able to attend physical therapy due to right foot fracture. Lumbar MRI imaging from October of 2023 exhibits grade 2 anterolisthesis at L5-S1 secondary to pars defects measures 11 mm that could be worse with flexion/extension of lumbar spine.  No history of lumbar surgery/injections.  Patient did consult with Dr. Meade Maw with Neurosurgery at [facility]. Per his notes anterolisthesis has increased considerably since CT of abdomen in 2009, now measuring 13.5-14 mm which is considered unstable. He did recommend surgical intervention including transforaminal lumbar interbody fusion with possible anterior fusion. He does not feel further conservative management will be beneficial in alleviating her pain. Patient states she would like to discuss possible injection, she does understand this is not a permanent means of "fixing" her problem. Patient denies focal weakness, numbness and tingling.   Patient has many reservations regarding  surgery. States family members report bad experiences with back surgery. She does carry diagnosis of osteoporosis that is managed by her primary care provider Dr. Allyn Kenner, treated with Prolia injections. She reports multiple recent bilateral metatarsal foot fractures from non-traumatic injuries. Reports non healing fracture to right foot, continues to wear boot.    Review of Systems  Musculoskeletal:  Positive for back pain.  Neurological:  Negative for tingling, sensory change, focal weakness and weakness.  All other systems reviewed and are negative.  Otherwise per HPI.  Assessment & Plan: Visit Diagnoses:    ICD-10-CM   1. Lumbar radiculopathy  M54.16 Ambulatory referral to Physical Medicine Rehab    Ambulatory referral to Orthopedic Surgery    2. Other spondylosis with radiculopathy, lumbar region  M47.26 Ambulatory referral to Physical Medicine Rehab    Ambulatory referral to Orthopedic Surgery    3. Pars defect of lumbar spine  M43.06 Ambulatory referral to Physical Medicine Rehab    Ambulatory referral to Orthopedic Surgery    4. Anterolisthesis of lumbar spine  M43.16 Ambulatory referral to Physical Medicine Rehab    Ambulatory referral to Orthopedic Surgery       Plan: Findings:  1. Chronic, worsening and severe bilateral lower back pain radiating down posterior legs to ankles, left greater then right.  Patient continues to have severe pain despite good conservative therapy such as home exercise regimen, rest and use of medications.  Patient's clinical presentation and exam are consistent with S1 nerve pattern.  There is grade 2 anterolisthesis at L5-S1 secondary to pars defects. Next step is to perform diagnostic and hopefully therapeutic bilateral S1 transforaminal epidural steroid injection  under fluoroscopic guidance. If good relief of pain with injection we can repeat infrequently as needed.  I did discuss continued exercise regimen with patient and provided her with Dr.  Tressie Ellis McGill's "The Big [employer]" exercises to perform at home.  Long-term I do think she would benefit from core strengthening and hamstring stretching.  I also placed order for consult with our spine surgeon Dr. Ileene Rubens to discuss possible surgical options.  I did reassure patient that Dr. Laurance Flatten is conservative with his surgical approach and would be able to answer any questions or concerns she may have. We will get her in quickly for injection. She is to continue with currently medication regimen. No new red flag symptoms noted upon exam today.     Meds & Orders: No orders of the defined types were placed in this encounter.   Orders Placed This Encounter  Procedures   Ambulatory referral to Physical Medicine Rehab   Ambulatory referral to Orthopedic Surgery    Follow-up: Return for BIlateral S1 transforaminal epidural steroid injection.   Procedures: No procedures performed      Clinical History: EXAM: MRI LUMBAR SPINE WITHOUT CONTRAST   TECHNIQUE: Multiplanar, multisequence MR imaging of the lumbar spine was performed. No intravenous contrast was administered.   COMPARISON:  MRI of lumbar spine 7/26/7. Lumbar spine radiographs 09/21/2021 ext field 5 non rib-bearing lumbar type vertebral bodies are present. The lowest fully formed vertebral body is L5.   FINDINGS: Segmentation: 5 non rib-bearing lumbar type vertebral bodies are present. The lowest fully formed vertebral body is L5.   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 Slight retrolisthesis is present at L4-5. No other significant listhesis is present.   Vertebrae: Chronic sclerotic endplate changes are associated with the listhesis at L5-S1. Marrow signal and vertebral body heights are otherwise normal.   Conus medullaris and cauda equina: Conus extends to the L1 level. Conus and cauda equina appear normal.   Paraspinal and other soft tissues: Limited imaging the  abdomen is unremarkable. There is no significant adenopathy. No solid organ lesions are present.   Disc levels:   L1-2: Normal disc signal and height is present. No focal protrusion or stenosis is present.   L2-3: Normal disc signal and height is present. Mild disc bulging and facet hypertrophy is present without significant stenosis.   L3-4: Mild disc bulging is present. No significant protrusion or stenosis is present.   L4-5: A mild leftward disc protrusion is present. Mild facet hypertrophy is noted bilaterally. Mild left foraminal narrowing is present.   L5-S1: Uncovering of a broad-based disc protrusion is so seated with the listhesis. The central canal is patent. Moderate bilateral foraminal stenosis is slightly worse on the right with significant progression from the prior study.   IMPRESSION: 1. 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 This may be even worse with flexion or extension. Flexion/extension radiographs could be used for further evaluation. 2. Moderate bilateral foraminal stenosis at L5-S1 is slightly worse on the right with significant progression from the prior study. 3. Mild left foraminal narrowing at L4-5. 4. Mild disc bulging and facet hypertrophy at L2-3 and L3-4 without significant stenosis.     Electronically Signed   By: San Morelle M.D.   On: 11/06/2021 06:20   She reports that she has never smoked. She has never used smokeless tobacco. No results for input(s): "HGBA1C", "LABURIC" in the last 8760 hours.  Objective:  VS:  HT:    WT:   BMI:     BP:   HR: bpm  TEMP: ( )  RESP:  Physical Exam Vitals and nursing note reviewed.  HENT:     Head: Normocephalic and atraumatic.     Right Ear: External ear normal.     Left Ear: External ear normal.     Nose: Nose normal.     Mouth/Throat:     Mouth: Mucous membranes are moist.  Eyes:     Extraocular Movements: Extraocular movements intact.   Cardiovascular:     Rate and Rhythm: Normal rate.     Pulses: Normal pulses.  Pulmonary:     Effort: Pulmonary effort is normal.  Abdominal:     General: Abdomen is flat. There is no distension.  Musculoskeletal:        General: Tenderness present.     Cervical back: Normal range of motion.     Comments: Pt rises from seated position to standing without difficulty. Good lumbar range of motion. Strong distal strength without clonus, no pain upon palpation of greater trochanters. Sensation intact bilaterally. Dysesthesias noted to bilateral S1 dermatomes. Walks independently, gait steady.   Skin:    General: Skin is warm and dry.     Capillary Refill: Capillary refill takes less than 2 seconds.  Neurological:     General: No focal deficit present.     Mental Status: She is alert and oriented to person, place, and time.  Psychiatric:        Mood and Affect: Mood normal.        Behavior: Behavior normal.     Ortho Exam  Imaging: No results found.  Past Medical/Family/Surgical/Social History: Medications & Allergies reviewed per EMR, new medications updated. 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   ILIOTIBIAL BAND SYNDROME, LEFT KNEE 02/27/2010   Past Medical History:  Diagnosis Date   Breast cancer ([place]) 2000   right   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   Diverticulosis    History of shingles    Hypercholesteremia    Hyperlipidemia    Iron deficiency    Iron deficiency anemia 07/19/2010   Necrosis, breast  fat    Osteopenia    Pars flaccida    lumbar back   PE (pulmonary embolism)    Pulled muscle may 2013   back   Pulmonary embolism ([place]) 07/19/2010   Tendonitis of elbow, right    History of   Family History  Problem Relation Age of Onset   Cancer Other        family history    Diabetes Other        family history    Heart defect Other        family history    Dementia Mother    Breast cancer Maternal Aunt    Colon cancer Neg Hx    Past Surgical History:  Procedure Laterality Date   COLONOSCOPY N/A 11/05/2018   Procedure: COLONOSCOPY;  Surgeon: Rogene Houston, MD;  [facility];  Service: Endoscopy;  Laterality: N/A;  67   EYE SURGERY     LAPAROSCOPIC NISSEN FUNDOPLICATION  [id number]   MASTECTOMY     right mastectomy with TRAM   MASTECTOMY W/ NODES PARTIAL     had right partial mastectomy with sentinel lymph node biopsy   POLYPECTOMY  11/05/2018   Procedure: POLYPECTOMY;  Surgeon: Rogene Houston, MD;  [facility];  Service: Endoscopy;;  colon   THROAT SURGERY     left vocal cord implants   TRAM     vocal cord surgery     left   Social History   Occupational History   Not on file  Tobacco Use   Smoking status: Never   Smokeless tobacco: Never  Vaping Use   Vaping Use: Never used  Substance and Sexual Activity   Alcohol use: No   Drug use: Never   Sexual activity: Not on file

## 2022-03-06 NOTE — Progress Notes (Signed)
   03/06/22 [place] in the past year? 0  Number of falls in past year 0  Was there an injury with Fall? 0  Fall Risk Category Calculator 0  Fall Risk  Patient at Risk for Falls Due to No Fall Risks  Fall risk Follow up Falls evaluation completed

## 2022-03-06 NOTE — Progress Notes (Signed)
Functional Pain Scale - descriptive words and definitions  Moderate (4)   Constantly aware of pain, can complete ADLs with modification/sleep marginally affected at times/passive distraction is of no use, but active distraction gives some relief. Moderate range order  Average Pain  varies  Lower back pain in the center that radiates down both legs but the left leg is worse. Lying in bed, bending backwards and twisting makes pain worse

## 2022-03-06 NOTE — Patient Instructions (Signed)

## 2022-03-07 ENCOUNTER — Telehealth: Payer: Self-pay

## 2022-03-07 ENCOUNTER — Other Ambulatory Visit: Payer: Self-pay | Admitting: Physical Medicine and Rehabilitation

## 2022-03-07 MED ORDER — DIAZEPAM 5 MG PO TABS: ORAL_TABLET | ORAL | 0 refills | Status: DC

## 2022-03-07 NOTE — Telephone Encounter (Signed)
Patient is scheduled for injection on 03/15/22. Needs pre procedure Valium

## 2022-03-08 DIAGNOSIS — H52223 Regular astigmatism, bilateral: Secondary | ICD-10-CM | POA: Diagnosis not present

## 2022-03-14 DIAGNOSIS — S92354D Nondisplaced fracture of fifth metatarsal bone, right foot, subsequent encounter for fracture with routine healing: Secondary | ICD-10-CM | POA: Insufficient documentation

## 2022-03-15 ENCOUNTER — Ambulatory Visit: Payer: No Typology Code available for payment source | Admitting: Physical Medicine and Rehabilitation

## 2022-03-15 ENCOUNTER — Ambulatory Visit: Payer: Self-pay

## 2022-03-15 VITALS — BP 128/75 | HR 62

## 2022-03-15 DIAGNOSIS — M5416 Radiculopathy, lumbar region: Secondary | ICD-10-CM | POA: Diagnosis not present

## 2022-03-15 MED ORDER — METHYLPREDNISOLONE ACETATE 80 MG/ML IJ SUSP
80.0000 mg | Freq: Once | INTRAMUSCULAR | Status: AC
  Administered 2022-03-15: 80 mg

## 2022-03-15 NOTE — Progress Notes (Signed)
Functional Pain Scale - descriptive words and definitions  Moderate (4)   Constantly aware of pain, can complete ADLs with modification/sleep marginally affected at times/passive distraction is of no use, but active distraction gives some relief. Moderate range order  Average Pain 4   +Driver, -BT, -Dye Allergies.  Lower back pain on both sides with radiation in left leg to the calf

## 2022-03-15 NOTE — Patient Instructions (Signed)

## 2022-03-19 ENCOUNTER — Ambulatory Visit (INDEPENDENT_AMBULATORY_CARE_PROVIDER_SITE_OTHER): Payer: No Typology Code available for payment source | Admitting: Orthopedic Surgery

## 2022-03-19 ENCOUNTER — Encounter: Payer: Self-pay | Admitting: Orthopedic Surgery

## 2022-03-19 ENCOUNTER — Ambulatory Visit (INDEPENDENT_AMBULATORY_CARE_PROVIDER_SITE_OTHER): Payer: No Typology Code available for payment source

## 2022-03-19 DIAGNOSIS — S92354D Nondisplaced fracture of fifth metatarsal bone, right foot, subsequent encounter for fracture with routine healing: Secondary | ICD-10-CM

## 2022-03-19 DIAGNOSIS — S92354K Nondisplaced fracture of fifth metatarsal bone, right foot, subsequent encounter for fracture with nonunion: Secondary | ICD-10-CM

## 2022-03-19 NOTE — Patient Instructions (Signed)
While we are working on your approval forCT please go ahead and call to schedule your appointment with [facility] within at least one (1) week.   Central Scheduling [phone]  

## 2022-03-19 NOTE — Progress Notes (Signed)
Follow-up visit fracture care fifth metatarsal fracture right foot  The patient's fracture has been ongoing since November 15, 2021 we have tried several methods of treatment including casting bracing nonweightbearing partial weightbearing full weightbearing  She still has pain at the fracture site.  The fracture does not show any progression towards healing  Recommend CT scan to evaluate fracture for possible surgical intervention

## 2022-03-20 ENCOUNTER — Ambulatory Visit (HOSPITAL_COMMUNITY)
Admission: RE | Admit: 2022-03-20 | Discharge: 2022-03-20 | Disposition: A | Discharge status: Home / Self Care | Payer: No Typology Code available for payment source | Source: Ambulatory Visit | Attending: Orthopedic Surgery | Admitting: Orthopedic Surgery

## 2022-03-20 DIAGNOSIS — S92354A Nondisplaced fracture of fifth metatarsal bone, right foot, initial encounter for closed fracture: Secondary | ICD-10-CM | POA: Diagnosis not present

## 2022-03-20 DIAGNOSIS — S92354K Nondisplaced fracture of fifth metatarsal bone, right foot, subsequent encounter for fracture with nonunion: Secondary | ICD-10-CM | POA: Diagnosis not present

## 2022-03-26 ENCOUNTER — Encounter: Payer: Self-pay | Admitting: Orthopedic Surgery

## 2022-03-26 ENCOUNTER — Ambulatory Visit (INDEPENDENT_AMBULATORY_CARE_PROVIDER_SITE_OTHER): Payer: No Typology Code available for payment source | Admitting: Orthopedic Surgery

## 2022-03-26 DIAGNOSIS — S92354D Nondisplaced fracture of fifth metatarsal bone, right foot, subsequent encounter for fracture with routine healing: Secondary | ICD-10-CM

## 2022-03-26 DIAGNOSIS — S92354K Nondisplaced fracture of fifth metatarsal bone, right foot, subsequent encounter for fracture with nonunion: Secondary | ICD-10-CM

## 2022-03-26 NOTE — Progress Notes (Signed)
Chief Complaint  Patient presents with   Results    Foot CT fracture 11/15/21   Encounter Diagnosis  Name Primary?   Closed nondisplaced fracture of fifth metatarsal bone of right foot with routine healing, subsequent encounter 11/15/21 Yes   CT scan review.  I looked at the CAT scan compared it to the x-rays this is a nonunion I discussed with the patient  I would like to get some input from the foot and ankle specialist with the question  Bone stimulator?  ORIF with bone graft?  If ORIF plate or screw  Will call patient with final answer

## 2022-03-27 ENCOUNTER — Ambulatory Visit (INDEPENDENT_AMBULATORY_CARE_PROVIDER_SITE_OTHER): Payer: No Typology Code available for payment source | Admitting: Orthopedic Surgery

## 2022-03-27 ENCOUNTER — Encounter: Payer: Self-pay | Admitting: Orthopedic Surgery

## 2022-03-27 ENCOUNTER — Other Ambulatory Visit: Payer: Self-pay

## 2022-03-27 ENCOUNTER — Other Ambulatory Visit: Payer: Self-pay | Admitting: Orthopedic Surgery

## 2022-03-27 ENCOUNTER — Encounter (HOSPITAL_COMMUNITY): Payer: Self-pay | Admitting: Orthopedic Surgery

## 2022-03-27 DIAGNOSIS — S92354K Nondisplaced fracture of fifth metatarsal bone, right foot, subsequent encounter for fracture with nonunion: Secondary | ICD-10-CM

## 2022-03-27 DIAGNOSIS — S92351K Displaced fracture of fifth metatarsal bone, right foot, subsequent encounter for fracture with nonunion: Secondary | ICD-10-CM | POA: Diagnosis not present

## 2022-03-27 MED ORDER — OXYCODONE-ACETAMINOPHEN 5-325 MG PO TABS: 1.0000 | ORAL_TABLET | ORAL | 0 refills | Status: DC | PRN

## 2022-03-27 NOTE — Progress Notes (Signed)
Ms Loree denies chest pain or shortness of breath. Patient denies having any s/s of Covid in her household, also denies any known exposure to Covid. Ms Mahowald denies having pneumonia or or lower respiratory infections in the past 8 weeks. Patient has not had any s/s of upper respiratory in 4 weeks. Ms Marx states that she does have chronic runny nose.  PCP is Dr. Casimer Leek.

## 2022-03-27 NOTE — Progress Notes (Signed)
Kaitlyn Morrison - 67 y.o. female MRN [id number]  Date of birth: July 02, 1955  Office Visit Note: Visit Date: 03/15/2022 PCP: Celene Squibb, MD Referred by: Celene Squibb, MD  Subjective: Chief Complaint  Patient presents with   Lower Back - Pain   HPI:  Kaitlyn Morrison is a 67 y.o. female who comes in today at the request of Barnet Pall, FNP for planned Bilateral S1-2 Lumbar Transforaminal epidural steroid injection with fluoroscopic guidance.  The patient has failed conservative care including home exercise, medications, time and activity modification.  This injection will be diagnostic and hopefully therapeutic.  Please see requesting physician notes for further details and justification.   ROS Otherwise per HPI.  Assessment & Plan: Visit Diagnoses:    ICD-10-CM   1. Lumbar radiculopathy  M54.16 XR C-ARM NO REPORT    Epidural Steroid injection    methylPREDNISolone acetate (DEPO-MEDROL) injection 80 mg      Plan: No additional findings.   Meds & Orders:  Meds ordered this encounter  Medications   methylPREDNISolone acetate (DEPO-MEDROL) injection 80 mg    Orders Placed This Encounter  Procedures   XR C-ARM NO REPORT   Epidural Steroid injection    Follow-up: Return for visit to requesting provider as needed.   Procedures: No procedures performed  S1 Lumbosacral Transforaminal Epidural Steroid Injection - Sub-Pedicular Approach with Fluoroscopic Guidance   Patient: Kaitlyn Morrison      Date of Birth: 1955-11-14 MRN: [id number] PCP: Celene Squibb, MD      Visit Date: 03/15/2022   Universal Protocol:    Date/Time: 02/27/246:31 PM  Consent Given By: the patient  Position:  PRONE  Additional Comments: Vital signs were monitored before and after the procedure. Patient was prepped and draped in the usual sterile fashion. The correct patient, procedure, and site was verified.   Injection Procedure Details:  Procedure Site One Meds  Administered:  Meds ordered this encounter  Medications   methylPREDNISolone acetate (DEPO-MEDROL) injection 80 mg    Laterality: Bilateral  Location/Site:  S1 Foramen   Needle size: 22 ga.  Needle type: Spinal  Needle Placement: Transforaminal  Findings:   -Comments: Excellent flow of contrast along the nerve, nerve root and into the epidural space.  Epidurogram: Contrast epidurogram showed no nerve root cut off or restricted flow pattern.  Procedure Details: After squaring off the sacral end-plate to get a true AP view, the C-arm was positioned so that the best possible view of the S1 foramen was visualized. The soft tissues overlying this structure were infiltrated with 2-3 ml. of 1% Lidocaine without Epinephrine.    The spinal needle was inserted toward the target using a "trajectory" view along the fluoroscope beam.  Under AP and lateral visualization, the needle was advanced so it did not puncture dura. Biplanar projections were used to confirm position. Aspiration was confirmed to be negative for CSF and/or blood. A 1-2 ml. volume of Isovue-250 was injected and flow of contrast was noted at each level. Radiographs were obtained for documentation purposes.   After attaining the desired flow of contrast documented above, a 0.5 to 1.0 ml test dose of 0.25% Marcaine was injected into each respective transforaminal space.  The patient was observed for 90 seconds post injection.  After no sensory deficits were reported, and normal lower extremity motor function was noted,   the above injectate was administered so that equal amounts of the injectate were placed at each foramen (level) into the  transforaminal epidural space.   Additional Comments:  No complications occurred Dressing: Band-Aid with 2 x 2 sterile gauze    Post-procedure details: Patient was observed during the procedure. Post-procedure instructions were reviewed.  Patient left the clinic in stable condition.    Clinical History: EXAM: MRI LUMBAR SPINE WITHOUT CONTRAST   TECHNIQUE: Multiplanar, multisequence MR imaging of the lumbar spine was performed. No intravenous contrast was administered.   COMPARISON:  MRI of lumbar spine 7/26/7. Lumbar spine radiographs 09/21/2021 ext field 5 non rib-bearing lumbar type vertebral bodies are present. The lowest fully formed vertebral body is L5.   FINDINGS: Segmentation: 5 non rib-bearing lumbar type vertebral bodies are present. The lowest fully formed vertebral body is L5.   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 Slight retrolisthesis is present at L4-5. No other significant listhesis is present.   Vertebrae: Chronic sclerotic endplate changes are associated with the listhesis at L5-S1. Marrow signal and vertebral body heights are otherwise normal.   Conus medullaris and cauda equina: Conus extends to the L1 level. Conus and cauda equina appear normal.   Paraspinal and other soft tissues: Limited imaging the abdomen is unremarkable. There is no significant adenopathy. No solid organ lesions are present.   Disc levels:   L1-2: Normal disc signal and height is present. No focal protrusion or stenosis is present.   L2-3: Normal disc signal and height is present. Mild disc bulging and facet hypertrophy is present without significant stenosis.   L3-4: Mild disc bulging is present. No significant protrusion or stenosis is present.   L4-5: A mild leftward disc protrusion is present. Mild facet hypertrophy is noted bilaterally. Mild left foraminal narrowing is present.   L5-S1: Uncovering of a broad-based disc protrusion is so seated with the listhesis. The central canal is patent. Moderate bilateral foraminal stenosis is slightly worse on the right with significant progression from the prior study.   IMPRESSION: 1. Grade 2 anterolisthesis at L5-S1 secondary to pars defects measures 11  mm. This may be even worse with flexion or extension. Flexion/extension radiographs could be used for further evaluation. 2. Moderate bilateral foraminal stenosis at L5-S1 is slightly worse on the right with significant progression from the prior study. 3. Mild left foraminal narrowing at L4-5. 4. Mild disc bulging and facet hypertrophy at L2-3 and L3-4 without significant stenosis.     Electronically Signed   By: San Morelle M.D.   On: 11/06/2021 06:20     Objective:  VS:  HT:    WT:   BMI:     BP:128/75  HR:62bpm  TEMP: ( )  RESP:  Physical Exam Vitals and nursing note reviewed.  Constitutional:      General: She is not in acute distress.    Appearance: Normal appearance. She is not ill-appearing.  HENT:     Head: Normocephalic and atraumatic.     Right Ear: External ear normal.     Left Ear: External ear normal.  Eyes:     Extraocular Movements: Extraocular movements intact.  Cardiovascular:     Rate and Rhythm: Normal rate.     Pulses: Normal pulses.  Pulmonary:     Effort: Pulmonary effort is normal. No respiratory distress.  Abdominal:     General: There is no distension.     Palpations: Abdomen is soft.  Musculoskeletal:        General: Tenderness present.     Cervical back: Neck supple.  Right lower leg: No edema.     Left lower leg: No edema.     Comments: Patient has good distal strength with no pain over the greater trochanters.  No clonus or focal weakness.  Skin:    Findings: No erythema, lesion or rash.  Neurological:     General: No focal deficit present.     Mental Status: She is alert and oriented to person, place, and time.     Sensory: No sensory deficit.     Motor: No weakness or abnormal muscle tone.     Coordination: Coordination normal.  Psychiatric:        Mood and Affect: Mood normal.        Behavior: Behavior normal.      Imaging: No results found.

## 2022-03-27 NOTE — Procedures (Signed)
S1 Lumbosacral Transforaminal Epidural Steroid Injection - Sub-Pedicular Approach with Fluoroscopic Guidance   Patient: Kaitlyn Morrison      Date of Birth: 06-19-1955 MRN: [id number] PCP: Celene Squibb, MD      Visit Date: 03/15/2022   Universal Protocol:    Date/Time: 02/27/246:31 PM  Consent Given By: the patient  Position:  PRONE  Additional Comments: Vital signs were monitored before and after the procedure. Patient was prepped and draped in the usual sterile fashion. The correct patient, procedure, and site was verified.   Injection Procedure Details:  Procedure Site One Meds Administered:  Meds ordered this encounter  Medications   methylPREDNISolone acetate (DEPO-MEDROL) injection 80 mg    Laterality: Bilateral  Location/Site:  S1 Foramen   Needle size: 22 ga.  Needle type: Spinal  Needle Placement: Transforaminal  Findings:   -Comments: Excellent flow of contrast along the nerve, nerve root and into the epidural space.  Epidurogram: Contrast epidurogram showed no nerve root cut off or restricted flow pattern.  Procedure Details: After squaring off the sacral end-plate to get a true AP view, the C-arm was positioned so that the best possible view of the S1 foramen was visualized. The soft tissues overlying this structure were infiltrated with 2-3 ml. of 1% Lidocaine without Epinephrine.    The spinal needle was inserted toward the target using a "trajectory" view along the fluoroscope beam.  Under AP and lateral visualization, the needle was advanced so it did not puncture dura. Biplanar projections were used to confirm position. Aspiration was confirmed to be negative for CSF and/or blood. A 1-2 ml. volume of Isovue-250 was injected and flow of contrast was noted at each level. Radiographs were obtained for documentation purposes.   After attaining the desired flow of contrast documented above, a 0.5 to 1.0 ml test dose of 0.25% Marcaine was injected  into each respective transforaminal space.  The patient was observed for 90 seconds post injection.  After no sensory deficits were reported, and normal lower extremity motor function was noted,   the above injectate was administered so that equal amounts of the injectate were placed at each foramen (level) into the transforaminal epidural space.   Additional Comments:  No complications occurred Dressing: Band-Aid with 2 x 2 sterile gauze    Post-procedure details: Patient was observed during the procedure. Post-procedure instructions were reviewed.  Patient left the clinic in stable condition.

## 2022-03-27 NOTE — Progress Notes (Signed)
Office Visit Note   Patient: Kaitlyn Morrison           Date of Birth: Mar 15, 1955           MRN: [id number] Visit Date: 03/27/2022              Requested by: Celene Squibb, MD [place],  [place] [id number] PCP: Celene Squibb, MD  Chief Complaint  Patient presents with   Right Foot - Pain      HPI: Patient is a 67 year old woman who was seen for initial evaluation for nonunion base of fifth metatarsal right foot.  Patient is 5 months out from conservative treatment with a fracture boot.  Assessment & Plan: Visit Diagnoses:  1. 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  Risks and benefits were discussed including decreased sensation nonhealing of the bone need for additional surgery.  Patient states she understands wished to proceed at this time we will call in a prescription for Percocet for postop outpatient surgery tomorrow.  Follow-Up Instructions: Return in about 2 weeks (around 04/10/2022).   Ortho Exam  Patient is alert, oriented, no adenopathy, well-dressed, normal affect, normal respiratory effort. Examination patient is a good dorsalis pedis pulse she has a palpable defect base of the fifth metatarsal fracture.  CT scan shows a persistent nonunion.  The fracture site is tender to palpation.  Imaging: No results found. No images are attached to the encounter.  Labs: Lab Results  Component Value Date   REPTSTATUS 04/16/2019 FINAL 04/14/2019   CULT  04/14/2019    NO GROUP A STREP (S.PYOGENES) ISOLATED Performed at [facility], [place] [address] [place], [place] [address]      Lab Results  Component Value Date   ALBUMIN 4.0 08/13/2012   ALBUMIN 4.2 02/12/2012   ALBUMIN 4.4 08/08/2011    No results found for: "MG" No results found for: "VD25OH"  No results found for: "PREALBUMIN"    Latest Ref Rng & Units 08/13/2012     8:24 AM 08/08/2011    8:55 AM 02/13/2011    8:45 AM  CBC EXTENDED  WBC 4.0 - 10.5 K/uL 4.7  4.3  5.0   RBC 3.87 - 5.11 MIL/uL 4.67  4.66  4.71   Hemoglobin 12.0 - 15.0 g/dL 14.2  14.0  14.1   HCT 36.0 - 46.0 % 41.0  41.1  41.9   Platelets 150 - 400 K/uL 187  188  186   NEUT# 1.7 - 7.7 K/uL 2.9  2.4    Lymph# 0.7 - 4.0 K/uL 1.2  1.4       There is no height or weight on file to calculate BMI.  Orders:  No orders of the defined types were placed in this encounter.  No orders of the defined types were placed in this encounter.    Procedures: No procedures performed  Clinical Data: No additional findings.  ROS:  All other systems negative, except as noted in the HPI. Review of Systems  Objective: Vital Signs: There were no vitals taken for this visit.  Specialty Comments:  EXAM: MRI LUMBAR SPINE WITHOUT CONTRAST   TECHNIQUE: Multiplanar, multisequence MR imaging of the lumbar spine was performed. No intravenous contrast was administered.   COMPARISON:  MRI of lumbar spine 7/26/7. Lumbar spine radiographs 09/21/2021 ext field 5 non rib-bearing lumbar  type vertebral bodies are present. The lowest fully formed vertebral body is L5.   FINDINGS: Segmentation: 5 non rib-bearing lumbar type vertebral bodies are present. The lowest fully formed vertebral body is L5.   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 Slight retrolisthesis is present at L4-5. No other significant listhesis is present.   Vertebrae: Chronic sclerotic endplate changes are associated with the listhesis at L5-S1. Marrow signal and vertebral body heights are otherwise normal.   Conus medullaris and cauda equina: Conus extends to the L1 level. Conus and cauda equina appear normal.   Paraspinal and other soft tissues: Limited imaging the abdomen is unremarkable. There is no significant adenopathy. No solid organ lesions are present.   Disc levels:    L1-2: Normal disc signal and height is present. No focal protrusion or stenosis is present.   L2-3: Normal disc signal and height is present. Mild disc bulging and facet hypertrophy is present without significant stenosis.   L3-4: Mild disc bulging is present. No significant protrusion or stenosis is present.   L4-5: A mild leftward disc protrusion is present. Mild facet hypertrophy is noted bilaterally. Mild left foraminal narrowing is present.   L5-S1: Uncovering of a broad-based disc protrusion is so seated with the listhesis. The central canal is patent. Moderate bilateral foraminal stenosis is slightly worse on the right with significant progression from the prior study.   IMPRESSION: 1. 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 This may be even worse with flexion or extension. Flexion/extension radiographs could be used for further evaluation. 2. Moderate bilateral foraminal stenosis at L5-S1 is slightly worse on the right with significant progression from the prior study. 3. Mild left foraminal narrowing at L4-5. 4. Mild disc bulging and facet hypertrophy at L2-3 and L3-4 without significant stenosis.     Electronically Signed   By: San Morelle M.D.   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   Iron deficiency anemia 07/19/2010   Pulmonary embolism ([place]) 07/19/2010   Tendonitis of elbow, right    DERANGEMENT OF POSTERIOR HORN OF MEDIAL MENISCUS 02/27/2010   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   ILIOTIBIAL BAND SYNDROME, LEFT KNEE 02/27/2010   Past Medical  History:  Diagnosis Date   Breast cancer ([place]) 2000   right   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   Diverticulosis    History of shingles    Hypercholesteremia    Hyperlipidemia    Iron deficiency    Iron deficiency anemia 07/19/2010   Necrosis, breast fat    Osteopenia    Pars flaccida    lumbar back   PE (pulmonary embolism)    Pulled muscle may 2013   back   Pulmonary embolism ([place]) 07/19/2010   Tendonitis of elbow, right    History of    Family History  Problem Relation Age of Onset   Cancer Other        family history    Diabetes Other        family history    Heart defect Other  family history    Dementia Mother    Breast cancer Maternal Aunt    Colon cancer Neg Hx     Past Surgical History:  Procedure Laterality Date   COLONOSCOPY N/A 11/05/2018   Procedure: COLONOSCOPY;  Surgeon: Rogene Houston, MD;  Location: [facility];  Service: Endoscopy;  Laterality: N/A;  62   EYE SURGERY     LAPAROSCOPIC NISSEN FUNDOPLICATION  [id number]   MASTECTOMY     right mastectomy with TRAM   MASTECTOMY W/ NODES PARTIAL     had right partial mastectomy with sentinel lymph node biopsy   POLYPECTOMY  11/05/2018   Procedure: POLYPECTOMY;  Surgeon: Rogene Houston, MD;  Location: [facility];  Service: Endoscopy;;  colon   THROAT SURGERY     left vocal cord implants   TRAM     vocal cord surgery     left   Social History   Occupational History   Not on file  Tobacco Use   Smoking status: Never   Smokeless tobacco: Never  Vaping Use   Vaping Use: Never used  Substance and Sexual Activity   Alcohol use: No   Drug use: Never   Sexual activity: Not on file

## 2022-03-28 ENCOUNTER — Ambulatory Visit (HOSPITAL_COMMUNITY)
Admission: RE | Admit: 2022-03-28 | Discharge: 2022-03-28 | Disposition: A | Discharge status: Home / Self Care | Payer: No Typology Code available for payment source | Attending: Orthopedic Surgery | Admitting: Orthopedic Surgery

## 2022-03-28 ENCOUNTER — Ambulatory Visit (HOSPITAL_COMMUNITY): Payer: No Typology Code available for payment source

## 2022-03-28 ENCOUNTER — Encounter (HOSPITAL_COMMUNITY)
Admission: RE | Disposition: A | Discharge status: Home / Self Care | Payer: Self-pay | Source: Home / Self Care | Attending: Orthopedic Surgery

## 2022-03-28 ENCOUNTER — Encounter (HOSPITAL_COMMUNITY): Payer: Self-pay | Admitting: Orthopedic Surgery

## 2022-03-28 ENCOUNTER — Other Ambulatory Visit: Payer: Self-pay

## 2022-03-28 ENCOUNTER — Ambulatory Visit (HOSPITAL_COMMUNITY): Payer: No Typology Code available for payment source | Admitting: Anesthesiology

## 2022-03-28 DIAGNOSIS — S92351K Displaced fracture of fifth metatarsal bone, right foot, subsequent encounter for fracture with nonunion: Secondary | ICD-10-CM

## 2022-03-28 DIAGNOSIS — K219 Gastro-esophageal reflux disease without esophagitis: Secondary | ICD-10-CM | POA: Diagnosis not present

## 2022-03-28 DIAGNOSIS — S92351A Displaced fracture of fifth metatarsal bone, right foot, initial encounter for closed fracture: Secondary | ICD-10-CM | POA: Diagnosis not present

## 2022-03-28 DIAGNOSIS — Z8249 Family history of ischemic heart disease and other diseases of the circulatory system: Secondary | ICD-10-CM | POA: Insufficient documentation

## 2022-03-28 DIAGNOSIS — X58XXXD Exposure to other specified factors, subsequent encounter: Secondary | ICD-10-CM | POA: Diagnosis not present

## 2022-03-28 HISTORY — DX: Unspecified convulsions: R56.9

## 2022-03-28 HISTORY — DX: Anxiety disorder, unspecified: F41.9

## 2022-03-28 HISTORY — DX: Depression, unspecified: F32.A

## 2022-03-28 HISTORY — PX: ORIF TOE FRACTURE: SHX5032

## 2022-03-28 HISTORY — DX: Unspecified osteoarthritis, unspecified site: M19.90

## 2022-03-28 HISTORY — DX: Family history of other specified conditions: Z84.89

## 2022-03-28 LAB — BASIC METABOLIC PANEL
Anion gap: 9 (ref 5–15)
BUN: 21 mg/dL (ref 8–23)
CO2: 23 mmol/L (ref 22–32)
Calcium: 8.8 mg/dL — ABNORMAL LOW (ref 8.9–10.3)
Chloride: 107 mmol/L (ref 98–111)
Creatinine, Ser: 0.83 mg/dL (ref 0.44–1.00)
GFR, Estimated: >60 mL/min (ref >60)
Glucose, Bld: 93 mg/dL (ref 70–99)
Potassium: 3.6 mmol/L (ref 3.5–5.1)
Sodium: 139 mmol/L (ref 135–145)

## 2022-03-28 LAB — CBC
HCT: 34.4 % — ABNORMAL LOW (ref 36.0–46.0)
Hemoglobin: 11.1 g/dL — ABNORMAL LOW (ref 12.0–15.0)
MCH: 25 pg — ABNORMAL LOW (ref 26.0–34.0)
MCHC: 32.3 g/dL (ref 30.0–36.0)
MCV: 77.5 fL — ABNORMAL LOW (ref 80.0–100.0)
Platelets: 260 10*3/uL (ref 150–400)
RBC: 4.44 MIL/uL (ref 3.87–5.11)
RDW: 16.2 % — ABNORMAL HIGH (ref 11.5–15.5)
WBC: 7.5 10*3/uL (ref 4.0–10.5)
nRBC: 0 % (ref 0.0–0.2)

## 2022-03-28 SURGERY — OPEN REDUCTION INTERNAL FIXATION (ORIF) METATARSAL (TOE) FRACTURE
Anesthesia: Monitor Anesthesia Care | Site: Foot | Laterality: Right

## 2022-03-28 MED ORDER — ROPIVACAINE HCL 5 MG/ML IJ SOLN
INTRAMUSCULAR | Status: DC | PRN
  Administered 2022-03-28: 25 mL via PERINEURAL

## 2022-03-28 MED ORDER — FENTANYL CITRATE (PF) 100 MCG/2ML IJ SOLN: 50.0000 ug | Freq: Once | INTRAMUSCULAR | Status: AC

## 2022-03-28 MED ORDER — CHLORHEXIDINE GLUCONATE 0.12 % MT SOLN: 15.0000 mL | Freq: Once | OROMUCOSAL | Status: AC

## 2022-03-28 MED ORDER — MIDAZOLAM HCL 2 MG/2ML IJ SOLN: 1.0000 mg | Freq: Once | INTRAMUSCULAR | Status: AC

## 2022-03-28 MED ORDER — MIDAZOLAM HCL 2 MG/2ML IJ SOLN
INTRAMUSCULAR | Status: AC
  Administered 2022-03-28: 1 mg via INTRAVENOUS
  Filled 2022-03-28: qty 2

## 2022-03-28 MED ORDER — PROPOFOL 500 MG/50ML IV EMUL
INTRAVENOUS | Status: DC | PRN
  Administered 2022-03-28: 30 ug via INTRAVENOUS
  Administered 2022-03-28: 125 ug/kg/min via INTRAVENOUS

## 2022-03-28 MED ORDER — ONDANSETRON HCL 4 MG/2ML IJ SOLN
INTRAMUSCULAR | Status: DC | PRN
  Administered 2022-03-28: 4 mg via INTRAVENOUS

## 2022-03-28 MED ORDER — FENTANYL CITRATE (PF) 100 MCG/2ML IJ SOLN
INTRAMUSCULAR | Status: AC
  Administered 2022-03-28: 50 ug via INTRAVENOUS
  Filled 2022-03-28: qty 2

## 2022-03-28 MED ORDER — CHLORHEXIDINE GLUCONATE 0.12 % MT SOLN
OROMUCOSAL | Status: AC
  Administered 2022-03-28: 15 mL via OROMUCOSAL
  Filled 2022-03-28: qty 15

## 2022-03-28 MED ORDER — 0.9 % SODIUM CHLORIDE (POUR BTL) OPTIME
TOPICAL | Status: DC | PRN
  Administered 2022-03-28: 1000 mL

## 2022-03-28 MED ORDER — DEXAMETHASONE SODIUM PHOSPHATE 10 MG/ML IJ SOLN
INTRAMUSCULAR | Status: DC | PRN
  Administered 2022-03-28: 5 mg via INTRAVENOUS

## 2022-03-28 MED ORDER — ORAL CARE MOUTH RINSE: 15.0000 mL | Freq: Once | OROMUCOSAL | Status: AC

## 2022-03-28 MED ORDER — CEFAZOLIN SODIUM-DEXTROSE 2-4 GM/100ML-% IV SOLN
2.0000 g | INTRAVENOUS | Status: AC
  Administered 2022-03-28: 2 g via INTRAVENOUS
  Filled 2022-03-28: qty 100

## 2022-03-28 MED ORDER — LACTATED RINGERS IV SOLN: INTRAVENOUS | Status: DC

## 2022-03-28 SURGICAL SUPPLY — 55 items
BAG COUNTER SPONGE SURGICOUNT (BAG) ×1 IMPLANT
BAG SPNG CNTER NS LX DISP (BAG)
BIT DRILL 3.2X150 CANNULATED (BIT) IMPLANT
BLADE AVERAGE 25X9 (BLADE) IMPLANT
BLADE MINI RND TIP GREEN BEAV (BLADE) IMPLANT
BNDG CMPR 5X4 CHSV STRCH STRL (GAUZE/BANDAGES/DRESSINGS) ×1
BNDG CMPR 5X6 CHSV STRCH STRL (GAUZE/BANDAGES/DRESSINGS)
BNDG CMPR 9X4 STRL LF SNTH (GAUZE/BANDAGES/DRESSINGS)
BNDG COHESIVE 1X5 TAN STRL LF (GAUZE/BANDAGES/DRESSINGS) IMPLANT
BNDG COHESIVE 4X5 TAN STRL LF (GAUZE/BANDAGES/DRESSINGS) IMPLANT
BNDG COHESIVE 6X5 TAN ST LF (GAUZE/BANDAGES/DRESSINGS) IMPLANT
BNDG ESMARK 4X9 LF (GAUZE/BANDAGES/DRESSINGS) ×1 IMPLANT
BNDG GAUZE DERMACEA FLUFF 4 (GAUZE/BANDAGES/DRESSINGS) IMPLANT
BNDG GZE DERMACEA 4 6PLY (GAUZE/BANDAGES/DRESSINGS) ×1
CORD BIPOLAR FORCEPS 12FT (ELECTRODE) ×1 IMPLANT
COTTON STERILE ROLL (GAUZE/BANDAGES/DRESSINGS) IMPLANT
COVER SURGICAL LIGHT HANDLE (MISCELLANEOUS) ×2 IMPLANT
DRAPE OEC MINIVIEW 54X84 (DRAPES) IMPLANT
DRAPE U-SHAPE 47X51 STRL (DRAPES) ×1 IMPLANT
DRILL 3.2X150 CANNULATED (BIT) ×1
DRSG ADAPTIC 3X8 NADH LF (GAUZE/BANDAGES/DRESSINGS) IMPLANT
DURAPREP 26ML APPLICATOR (WOUND CARE) ×1 IMPLANT
ELECT REM PT RETURN 9FT ADLT (ELECTROSURGICAL) ×1
ELECTRODE REM PT RTRN 9FT ADLT (ELECTROSURGICAL) ×1 IMPLANT
GAUZE SPONGE 2X2 8PLY STRL LF (GAUZE/BANDAGES/DRESSINGS) IMPLANT
GAUZE SPONGE 4X4 12PLY STRL (GAUZE/BANDAGES/DRESSINGS) IMPLANT
GLOVE BIOGEL PI IND STRL 9 (GLOVE) ×1 IMPLANT
GLOVE SURG ORTHO 9.0 STRL STRW (GLOVE) ×1 IMPLANT
GOWN STRL REUS W/ TWL XL LVL3 (GOWN DISPOSABLE) ×2 IMPLANT
GOWN STRL REUS W/TWL XL LVL3 (GOWN DISPOSABLE) ×3
KIT BASIN OR (CUSTOM PROCEDURE TRAY) ×1 IMPLANT
KIT TURNOVER KIT B (KITS) ×1 IMPLANT
NDL HYPO 25GX1X1/2 BEV (NEEDLE) IMPLANT
NEEDLE HYPO 25GX1X1/2 BEV (NEEDLE) IMPLANT
NS IRRIG 1000ML POUR BTL (IV SOLUTION) ×1 IMPLANT
PACK ORTHO EXTREMITY (CUSTOM PROCEDURE TRAY) ×1 IMPLANT
PAD ABD 8X10 STRL (GAUZE/BANDAGES/DRESSINGS) IMPLANT
PAD ARMBOARD 7.5X6 YLW CONV (MISCELLANEOUS) ×2 IMPLANT
PAD CAST 4YDX4 CTTN HI CHSV (CAST SUPPLIES) IMPLANT
PADDING CAST COTTON 4X4 STRL (CAST SUPPLIES)
SCREW CANN SHORT THD 5.5X44 (Screw) IMPLANT
SPECIMEN JAR SMALL (MISCELLANEOUS) ×1 IMPLANT
SUCTION FRAZIER HANDLE 10FR (MISCELLANEOUS)
SUCTION TUBE FRAZIER 10FR DISP (MISCELLANEOUS) IMPLANT
SUT ETHILON 2 0 PSLX (SUTURE) ×2 IMPLANT
SUT VIC AB 2-0 CT1 27 (SUTURE)
SUT VIC AB 2-0 CT1 TAPERPNT 27 (SUTURE) ×1 IMPLANT
SYR CONTROL 10ML LL (SYRINGE) IMPLANT
TAP 4.5MM CANNULATED (TAP) IMPLANT
TAP 5.5 (TAP) IMPLANT
TOWEL GREEN STERILE (TOWEL DISPOSABLE) ×1 IMPLANT
TOWEL GREEN STERILE FF (TOWEL DISPOSABLE) ×1 IMPLANT
TUBE CONNECTING 12X1/4 (SUCTIONS) IMPLANT
WATER STERILE IRR 1000ML POUR (IV SOLUTION) ×1 IMPLANT
WIRE SMOOTH NITINOL 1.6X200 (WIRE) IMPLANT

## 2022-03-28 NOTE — Anesthesia Preprocedure Evaluation (Signed)
Anesthesia Evaluation  Patient identified by MRN, date of birth, ID band Patient awake    Reviewed: Allergy & Precautions, H&P , NPO status , Patient's Chart, lab work & pertinent test results  Airway Mallampati: II   Neck ROM: full    Dental   Pulmonary neg pulmonary ROS   breath sounds clear to auscultation       Cardiovascular negative cardio ROS  Rhythm:regular Rate:Normal     Neuro/Psych Seizures -,  PSYCHIATRIC DISORDERS Anxiety Depression     Neuromuscular disease    GI/Hepatic ,GERD  ,,  Endo/Other    Renal/GU      Musculoskeletal  (+) Arthritis ,    Abdominal   Peds  Hematology   Anesthesia Other Findings   Reproductive/Obstetrics                             Anesthesia Physical Anesthesia Plan  ASA: 2  Anesthesia Plan: MAC and Regional   Post-op Pain Management:    Induction: Intravenous  PONV Risk Score and Plan: 2 and Ondansetron, Dexamethasone, Propofol infusion and Treatment may vary due to age or medical condition  Airway Management Planned: Simple Face Mask  Additional Equipment:   Intra-op Plan:   Post-operative Plan:   Informed Consent: I have reviewed the patients History and Physical, chart, labs and discussed the procedure including the risks, benefits and alternatives for the proposed anesthesia with the patient or authorized representative who has indicated his/her understanding and acceptance.     Dental advisory given  Plan Discussed with: CRNA, Anesthesiologist and Surgeon  Anesthesia Plan Comments:        Anesthesia Quick Evaluation

## 2022-03-28 NOTE — Op Note (Signed)
03/28/2022  9:29 AM  PATIENT:  Kaitlyn Morrison    PRE-OPERATIVE DIAGNOSIS:  Ronnald Ramp Fracture Base Right 5th Metatarsal nonunion  POST-OPERATIVE DIAGNOSIS:  Same  PROCEDURE:  OPEN REDUCTION INTERNAL FIXATION (ORIF) RIGHT 5TH  METATARSAL (TOE) FRACTURE. C-arm fluoroscopy verified reduction.  SURGEON:  Newt Minion, MD  PHYSICIAN ASSISTANT:None ANESTHESIA:   General  PREOPERATIVE INDICATIONS:  Kaitlyn Morrison is a  67 y.o. female with a diagnosis of Jones Fracture Base Right 5th Metatarsal who failed conservative measures and elected for surgical management.    The risks benefits and alternatives were discussed with the patient preoperatively including but not limited to the risks of infection, bleeding, nerve injury, cardiopulmonary complications, the need for revision surgery, among others, and the patient was willing to proceed.  OPERATIVE IMPLANTS: 5.5 x 44 mm screw  '@ENCIMAGES'$ @  OPERATIVE FINDINGS: Cecal nephroscopy verified reduction of the fracture in both AP and lateral radiographs  OPERATIVE PROCEDURE: Patient was brought the operating room after undergoing a regional anesthetic.  She then underwent a MAC anesthetic.  After adequate levels anesthesia were obtained the right lower extremity was prepped using DuraPrep draped into a sterile field a timeout was called.  A longitudinal incision was made through the skin just proximal to the base of the fifth metatarsal right foot.  Blunt dissection was carried down to the base of the fifth metatarsal.  A wire was then inserted from the base of the fifth metatarsal down the shaft.  See arthroscopy verified alignment.  The cortex was drilled and this was tapped with a 4.5 and then a 5.5 tap.  A 5.5 partially-threaded cannulated screw was inserted this had a good fit.  C-arm possibly verified reduction of the fracture.  The wound was irrigated normal saline incision closed using 2-0 nylon a sterile dressing was applied patient  was taken the PACU in stable condition.   DISCHARGE PLANNING:  Antibiotic duration: Preoperative antibiotics  Weightbearing: Touchdown weightbearing on the right  Pain medication: Patient has picked up her prescription for Percocet  Dressing care/ Wound VAC: Dry dressing  Ambulatory devices: Crutches walker or kneeling scooter  Discharge to: Home.  Follow-up: In the office 1 week post operative.

## 2022-03-28 NOTE — Anesthesia Procedure Notes (Signed)
Anesthesia Regional Block: Popliteal block   Pre-Anesthetic Checklist: , timeout performed,  Correct Patient, Correct Site, Correct Laterality,  Correct Procedure, Correct Position, site marked,  Risks and benefits discussed,  Surgical consent,  Pre-op evaluation,  At surgeon's request and post-op pain management  Laterality: Right  Prep: chloraprep       Needles:  Injection technique: Single-shot  Needle Type: Echogenic Stimulator Needle          Additional Needles:   Procedures:, nerve stimulator,,,,,     Nerve Stimulator or Paresthesia:  Response: plantar flexion of foot, 0.45 mA  Additional Responses:   Narrative:  Start time: 03/28/2022 7:50 AM End time: 03/28/2022 8:00 AM Injection made incrementally with aspirations every 5 mL.  Performed by: Personally  Anesthesiologist: Albertha Ghee, MD  Additional Notes: Functioning IV was confirmed and monitors were applied.  A 28m 21ga Arrow echogenic stimulator needle was used. Sterile prep and drape,hand hygiene and sterile gloves were used.  Negative aspiration and negative test dose prior to incremental administration of local anesthetic. The patient tolerated the procedure well.  Ultrasound guidance: relevent anatomy identified, needle position confirmed, local anesthetic spread visualized around nerve(s), vascular puncture avoided.  Image printed for medical record.

## 2022-03-28 NOTE — Transfer of Care (Signed)
Immediate Anesthesia Transfer of Care Note  Patient: Kaitlyn Morrison  Procedure(s) Performed: OPEN REDUCTION INTERNAL FIXATION (ORIF) RIGHT 5TH  METATARSAL (TOE) FRACTURE (Right: Foot)  Patient Location: PACU  Anesthesia Type:MAC and Regional  Level of Consciousness: awake and patient cooperative  Airway & Oxygen Therapy: Patient Spontanous Breathing and Patient connected to face mask oxygen  Post-op Assessment: Report given to RN, Post -op Vital signs reviewed and stable, and Patient moving all extremities  Post vital signs: Reviewed and stable  Last Vitals:  Vitals Value Taken Time  BP 102/55 03/28/22 0932  Temp    Pulse 65 03/28/22 0934  Resp 16 03/28/22 0934  SpO2 100 % 03/28/22 0934  Vitals shown include unvalidated device data.  Last Pain:  Vitals:   03/28/22 0800  TempSrc:   PainSc: 0-No pain         Complications: No notable events documented.
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
## 2022-03-28 NOTE — Progress Notes (Signed)
Orthopedic Tech Progress Note Patient Details:  Kaitlyn Morrison September 17, 1955 [id number]  Ortho Devices Type of Ortho Device: Postop shoe/boot Ortho Device/Splint Interventions: Ordered      [name] 03/28/2022, 9:47 AM

## 2022-03-29 ENCOUNTER — Encounter: Payer: Self-pay | Admitting: Radiology

## 2022-03-29 ENCOUNTER — Encounter (HOSPITAL_COMMUNITY): Payer: Self-pay | Admitting: Orthopedic Surgery

## 2022-03-29 NOTE — Anesthesia Postprocedure Evaluation (Signed)
Anesthesia Post Note  Patient: Jendaya Marchenko  Procedure(s) Performed: OPEN REDUCTION INTERNAL FIXATION (ORIF) RIGHT 5TH  METATARSAL (TOE) FRACTURE (Right: Foot)     Patient location during evaluation: PACU Anesthesia Type: Regional and MAC Level of consciousness: awake and alert Pain management: pain level controlled Vital Signs Assessment: post-procedure vital signs reviewed and stable Respiratory status: spontaneous breathing, nonlabored ventilation, respiratory function stable and patient connected to nasal cannula oxygen Cardiovascular status: stable and blood pressure returned to baseline Postop Assessment: no apparent nausea or vomiting Anesthetic complications: no   No notable events documented.  Last Vitals:  Vitals:   03/28/22 0945 03/28/22 0956  BP: 120/60 130/64  Pulse: 62 (!) 59  Resp: 14 14  Temp:  36.6 C  SpO2: 98% 98%    Last Pain:  Vitals:   03/28/22 0956  TempSrc:   PainSc: 0-No pain                 Margarie Mcguirt S

## 2022-04-05 ENCOUNTER — Ambulatory Visit: Payer: No Typology Code available for payment source | Admitting: Orthopedic Surgery

## 2022-04-09 ENCOUNTER — Ambulatory Visit (INDEPENDENT_AMBULATORY_CARE_PROVIDER_SITE_OTHER): Payer: No Typology Code available for payment source

## 2022-04-09 ENCOUNTER — Encounter: Payer: Self-pay | Admitting: Orthopedic Surgery

## 2022-04-09 ENCOUNTER — Ambulatory Visit: Payer: No Typology Code available for payment source | Admitting: Orthopedic Surgery

## 2022-04-09 ENCOUNTER — Telehealth: Payer: Self-pay

## 2022-04-09 DIAGNOSIS — S92351K Displaced fracture of fifth metatarsal bone, right foot, subsequent encounter for fracture with nonunion: Secondary | ICD-10-CM
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
## 2022-04-09 NOTE — Telephone Encounter (Signed)
Dental office called needing something sent to them that patient does not need pre-med before appt.  Also asking if she needs to wait a "certain time" like 3 months before dental cleaning after surgery Fax# [phone]

## 2022-04-10 ENCOUNTER — Other Ambulatory Visit: Payer: Self-pay | Admitting: Orthopedic Surgery

## 2022-04-10 NOTE — Telephone Encounter (Signed)
Note written and faxed to dentist office. No pre meds required. No waiting in between from surgery to dental appt.

## 2022-04-16 DIAGNOSIS — H52223 Regular astigmatism, bilateral: Secondary | ICD-10-CM | POA: Diagnosis not present

## 2022-04-26 ENCOUNTER — Ambulatory Visit: Payer: No Typology Code available for payment source | Admitting: Orthopedic Surgery

## 2022-04-26 ENCOUNTER — Encounter: Payer: No Typology Code available for payment source | Admitting: Orthopedic Surgery

## 2022-05-01 ENCOUNTER — Ambulatory Visit (INDEPENDENT_AMBULATORY_CARE_PROVIDER_SITE_OTHER): Payer: No Typology Code available for payment source

## 2022-05-01 ENCOUNTER — Other Ambulatory Visit (INDEPENDENT_AMBULATORY_CARE_PROVIDER_SITE_OTHER): Payer: No Typology Code available for payment source

## 2022-05-01 ENCOUNTER — Encounter: Payer: Self-pay | Admitting: Orthopedic Surgery

## 2022-05-01 ENCOUNTER — Ambulatory Visit (INDEPENDENT_AMBULATORY_CARE_PROVIDER_SITE_OTHER): Payer: No Typology Code available for payment source | Admitting: Orthopedic Surgery

## 2022-05-01 VITALS — BP 124/72 | HR 69 | Ht 64.0 in | Wt 140.0 lb

## 2022-05-01 DIAGNOSIS — M4316 Spondylolisthesis, lumbar region: Secondary | ICD-10-CM

## 2022-05-01 DIAGNOSIS — Z9889 Other specified postprocedural states: Secondary | ICD-10-CM

## 2022-05-01 DIAGNOSIS — Z8781 Personal history of (healed) traumatic fracture: Secondary | ICD-10-CM

## 2022-05-01 DIAGNOSIS — M4726 Other spondylosis with radiculopathy, lumbar region: Secondary | ICD-10-CM

## 2022-05-01 DIAGNOSIS — M4306 Spondylolysis, lumbar region: Secondary | ICD-10-CM

## 2022-05-01 DIAGNOSIS — M4317 Spondylolisthesis, lumbosacral region: Secondary | ICD-10-CM

## 2022-05-01 DIAGNOSIS — M5416 Radiculopathy, lumbar region: Secondary | ICD-10-CM

## 2022-05-01 NOTE — Progress Notes (Signed)
Orthopedic Spine Surgery Office Note  Assessment: Patient is a 67 y.o. female with low back pain that radiates into her bilateral lower extremities.  Feels it along the lateral aspect of her thighs into the posterior calves.  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   Plan: -Patient has tried Tylenol, physical therapy, oral steroids, steroid injections, Robaxin -She has tried all conservative treatments and has had severe symptoms for over a year now, so discussed operative management as a option for her.  Talked about a L5/S1 ALIF and posterior instrumented spinal fusion as her surgical option.  She is hesitant to undergo spine surgery so she wants to think about it more.  I told her she can call the office if she has more questions.  I can also see her back at any time.  I told her I would definitely want to see her again in office if she decides she wants to go the surgical route -Patient should return to office on an as-needed basis   Patient expressed understanding of the plan and all questions were answered to the patient's satisfaction.   ___________________________________________________________________________   History:  Patient is a 67 y.o. female who presents today for lumbar spine.  Patient has a multiyear history of low back pain that has started to radiate into her bilateral lower extremities in the last year or so.  Pain is felt in her back and it radiates into the bilateral lateral thighs and posterior calves.  She does not feel the pain radiate into her feet.  There is no trauma or injury that brought on this pain.  Pain has been getting progressively worse with time.  She has tried multiple conservative treatments without any significant relief except for steroid injection.  She did get significant leg pain relief with her injection but it was not long-lasting.  Denies paresthesias and numbness.   Weakness: Yes, bilateral legs feel weak  when pain is particular severe.  No other weakness noted Symptoms of imbalance: Denies Paresthesias and numbness: Denies Bowel or bladder incontinence: Denies Saddle anesthesia: Denies  Treatments tried: Tylenol, physical therapy, oral steroids, steroid injections, Robaxin  Review of systems: Denies fevers and chills, night sweats, unexplained weight loss, history of cancer.  Has had pain that wakes her at night  Past medical history: Anxiety/depression Breast cancer Hyperlipidemia Diverticulosis Iron deficiency anemia History of PE Osteoporosis (on Prolia for last 2 years, last T score was -0.9 on 06/20/2021)  Allergies: Iodinated contrast, iohexol  Past surgical history:  Nissen fundoplication Mastectomy ORIF right fifth metatarsal fracture Polypectomy Vocal cord implant surgery  Social history: Denies use of nicotine product (smoking, vaping, patches, smokeless) Alcohol use: denies Denies recreational drug use   Physical Exam:  General: no acute distress, appears stated age Neurologic: alert, answering questions appropriately, following commands Respiratory: unlabored breathing on room air, symmetric chest rise Psychiatric: appropriate affect, normal cadence to speech   MSK (spine):  -Strength exam      Left  Right EHL    5/5  5/5 TA    5/5  5/5 GSC    5/5  5/5 Knee extension  5/5  5/5 Hip flexion   5/5  5/5  -Sensory exam    Sensation intact to light touch in L3-S1 nerve distributions of bilateral lower extremities  -Achilles DTR: 2/4 on the left, 2/4 on the right -Patellar tendon DTR: 2/4 on the left, 2/4 on the right  -Straight leg raise: Negative bilaterally -Femoral nerve stretch  test: Negative bilaterally -Clonus: no beats bilaterally  -Left hip exam: Positive FADIR, no pain through remainder of range of motion, negative FABER, negative Stinchfield -Right hip exam: No pain through range of motion, negative Stinchfield, negative  FABER  Imaging: XR of the lumbar spine from 05/01/2022 was independently reviewed and interpreted, showing spondylolisthesis at L5/S1 that shifts about 2.53mm between flexion and extension views.  Disc height loss at L5/S1.  No other significant degenerative changes. PI of 72, LL of 64.   MRI of the lumbar spine from 11/03/2021 was independently reviewed and interpreted, showing spondylolisthesis with bilateral pars defects at L5/S1.  Degenerative disc with disc height loss at L5/S1.  Bilateral foraminal stenosis at L5/S1.   Patient name: Kaitlyn Morrison Patient MRN: [id number] Date of visit: 05/01/22
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
## 2022-05-29 ENCOUNTER — Telehealth: Payer: Self-pay | Admitting: Orthopedic Surgery

## 2022-05-29 ENCOUNTER — Other Ambulatory Visit: Payer: Self-pay | Admitting: Orthopedic Surgery

## 2022-05-29 DIAGNOSIS — M5416 Radiculopathy, lumbar region: Secondary | ICD-10-CM

## 2022-05-29 MED ORDER — PREDNISONE 10 MG (48) PO TBPK: ORAL_TABLET | Freq: Every day | ORAL | 0 refills | Status: DC

## 2022-05-29 NOTE — Progress Notes (Signed)
Meds ordered this encounter  Medications  . predniSONE (STERAPRED UNI-PAK 48 TAB) 10 MG (48) TBPK tablet    Sig: Take by mouth daily. 10 mg ds 12 days    Dispense:  48 tablet    Refill:  0    

## 2022-05-29 NOTE — Telephone Encounter (Signed)
Dr. Mort Sawyers pt - the patient text me asking if there was any way she could see Dr. Romeo Apple today or tomorrow, her back is out.  Discussed w/Dr. Romeo Apple, he stated he can call her some Prednisone in.  [facility] [place].  Patient advised.

## 2022-05-31 ENCOUNTER — Other Ambulatory Visit: Payer: Self-pay | Admitting: Orthopedic Surgery

## 2022-05-31 DIAGNOSIS — M4306 Spondylolysis, lumbar region: Secondary | ICD-10-CM

## 2022-06-05 ENCOUNTER — Encounter: Payer: Self-pay | Admitting: Orthopedic Surgery

## 2022-06-05 ENCOUNTER — Ambulatory Visit (INDEPENDENT_AMBULATORY_CARE_PROVIDER_SITE_OTHER): Payer: No Typology Code available for payment source

## 2022-06-05 ENCOUNTER — Ambulatory Visit (INDEPENDENT_AMBULATORY_CARE_PROVIDER_SITE_OTHER): Payer: No Typology Code available for payment source | Admitting: Orthopedic Surgery

## 2022-06-05 DIAGNOSIS — M79671 Pain in right foot: Secondary | ICD-10-CM | POA: Diagnosis not present

## 2022-06-05 DIAGNOSIS — S92354D Nondisplaced fracture of fifth metatarsal bone, right foot, subsequent encounter for fracture with routine healing: Secondary | ICD-10-CM
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
## 2022-06-06 DIAGNOSIS — E782 Mixed hyperlipidemia: Secondary | ICD-10-CM | POA: Diagnosis not present

## 2022-06-06 DIAGNOSIS — M810 Age-related osteoporosis without current pathological fracture: Secondary | ICD-10-CM | POA: Diagnosis not present

## 2022-06-11 DIAGNOSIS — Z0001 Encounter for general adult medical examination with abnormal findings: Secondary | ICD-10-CM | POA: Diagnosis not present

## 2022-06-11 DIAGNOSIS — B079 Viral wart, unspecified: Secondary | ICD-10-CM | POA: Diagnosis not present

## 2022-06-11 DIAGNOSIS — E782 Mixed hyperlipidemia: Secondary | ICD-10-CM | POA: Diagnosis not present

## 2022-06-11 DIAGNOSIS — Z0000 Encounter for general adult medical examination without abnormal findings: Secondary | ICD-10-CM | POA: Diagnosis not present

## 2022-06-12 DIAGNOSIS — G4700 Insomnia, unspecified: Secondary | ICD-10-CM | POA: Diagnosis not present

## 2022-06-12 DIAGNOSIS — R718 Other abnormality of red blood cells: Secondary | ICD-10-CM | POA: Diagnosis not present

## 2022-06-12 DIAGNOSIS — M5442 Lumbago with sciatica, left side: Secondary | ICD-10-CM | POA: Diagnosis not present

## 2022-06-12 DIAGNOSIS — E782 Mixed hyperlipidemia: Secondary | ICD-10-CM | POA: Diagnosis not present

## 2022-06-12 DIAGNOSIS — S92351D Displaced fracture of fifth metatarsal bone, right foot, subsequent encounter for fracture with routine healing: Secondary | ICD-10-CM | POA: Diagnosis not present

## 2022-06-12 DIAGNOSIS — M189 Osteoarthritis of first carpometacarpal joint, unspecified: Secondary | ICD-10-CM | POA: Diagnosis not present

## 2022-06-12 DIAGNOSIS — J302 Other seasonal allergic rhinitis: Secondary | ICD-10-CM | POA: Diagnosis not present

## 2022-06-12 DIAGNOSIS — M4317 Spondylolisthesis, lumbosacral region: Secondary | ICD-10-CM | POA: Diagnosis not present

## 2022-06-12 DIAGNOSIS — M25561 Pain in right knee: Secondary | ICD-10-CM | POA: Diagnosis not present

## 2022-06-12 DIAGNOSIS — M5441 Lumbago with sciatica, right side: Secondary | ICD-10-CM | POA: Diagnosis not present

## 2022-06-12 DIAGNOSIS — F4321 Adjustment disorder with depressed mood: Secondary | ICD-10-CM | POA: Diagnosis not present

## 2022-06-12 DIAGNOSIS — M810 Age-related osteoporosis without current pathological fracture: Secondary | ICD-10-CM | POA: Diagnosis not present

## 2022-06-13 ENCOUNTER — Other Ambulatory Visit: Payer: Self-pay | Admitting: Internal Medicine

## 2022-06-13 DIAGNOSIS — Z1231 Encounter for screening mammogram for malignant neoplasm of breast: Secondary | ICD-10-CM

## 2022-06-14 ENCOUNTER — Telehealth: Payer: Self-pay

## 2022-06-14 ENCOUNTER — Other Ambulatory Visit: Payer: Self-pay | Admitting: Physical Medicine and Rehabilitation

## 2022-06-14 DIAGNOSIS — M5416 Radiculopathy, lumbar region: Secondary | ICD-10-CM
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
## 2022-07-05 ENCOUNTER — Telehealth: Payer: Self-pay | Admitting: Physical Medicine and Rehabilitation

## 2022-07-05 NOTE — Telephone Encounter (Signed)
Patient called. She would like to cxl her appointment with Dr. Alvester Morin.

## 2022-07-09 ENCOUNTER — Other Ambulatory Visit: Payer: Self-pay | Admitting: Physical Medicine and Rehabilitation

## 2022-07-09 ENCOUNTER — Encounter: Payer: No Typology Code available for payment source | Admitting: Physical Medicine and Rehabilitation

## 2022-07-09 DIAGNOSIS — L570 Actinic keratosis: Secondary | ICD-10-CM | POA: Diagnosis not present

## 2022-07-09 DIAGNOSIS — D485 Neoplasm of uncertain behavior of skin: Secondary | ICD-10-CM | POA: Diagnosis not present

## 2022-07-09 DIAGNOSIS — B079 Viral wart, unspecified: Secondary | ICD-10-CM | POA: Diagnosis not present

## 2022-07-09 MED ORDER — DIAZEPAM 5 MG PO TABS: ORAL_TABLET | ORAL | 0 refills | Status: DC
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
## 2022-07-13 DIAGNOSIS — M810 Age-related osteoporosis without current pathological fracture: Secondary | ICD-10-CM | POA: Diagnosis not present

## 2022-07-19 ENCOUNTER — Ambulatory Visit: Payer: No Typology Code available for payment source | Admitting: Physical Medicine and Rehabilitation

## 2022-07-19 ENCOUNTER — Other Ambulatory Visit: Payer: No Typology Code available for payment source

## 2022-07-19 VITALS — BP 98/62 | HR 74

## 2022-07-19 DIAGNOSIS — M5416 Radiculopathy, lumbar region: Secondary | ICD-10-CM

## 2022-07-19 MED ORDER — METHYLPREDNISOLONE ACETATE 80 MG/ML IJ SUSP
80.0000 mg | Freq: Once | INTRAMUSCULAR | Status: AC
  Administered 2022-07-19: 80 mg

## 2022-07-19 NOTE — Progress Notes (Signed)
Functional Pain Scale - descriptive words and definitions  Moderate (4)   Constantly aware of pain, can complete ADLs with modification/sleep marginally affected at times/passive distraction is of no use, but active distraction gives some relief. Moderate range order  Average Pain 5-6   +Driver, -BT, -Dye Allergies.  Lower back pain on both sides, only radiation in legs when sleeping on side

## 2022-07-19 NOTE — Patient Instructions (Signed)

## 2022-07-24 ENCOUNTER — Encounter: Payer: Self-pay | Admitting: Orthopedic Surgery

## 2022-07-26 ENCOUNTER — Ambulatory Visit
Admission: RE | Admit: 2022-07-26 | Discharge: 2022-07-26 | Disposition: A | Discharge status: Home / Self Care | Payer: No Typology Code available for payment source | Source: Ambulatory Visit | Attending: Internal Medicine | Admitting: Internal Medicine

## 2022-07-26 DIAGNOSIS — Z1231 Encounter for screening mammogram for malignant neoplasm of breast: Secondary | ICD-10-CM | POA: Diagnosis not present

## 2022-07-26 NOTE — Procedures (Signed)
S1 Lumbosacral Transforaminal Epidural Steroid Injection - Sub-Pedicular Approach with Fluoroscopic Guidance   Patient: Kaitlyn Morrison      Date of Birth: 05-Sep-1955 MRN: [id number] PCP: Benita Stabile, MD      Visit Date: 07/19/2022   Universal Protocol:    Date/Time: 06/27/248:43 PM  Consent Given By: the patient  Position:  PRONE  Additional Comments: Vital signs were monitored before and after the procedure. Patient was prepped and draped in the usual sterile fashion. The correct patient, procedure, and site was verified.   Injection Procedure Details:  Procedure Site One Meds Administered:  Meds ordered this encounter  Medications   methylPREDNISolone acetate (DEPO-MEDROL) injection 80 mg    Laterality: Bilateral  Location/Site:  S1 Foramen   Needle size: 22 ga.  Needle type: Spinal  Needle Placement: Transforaminal  Findings:   -Comments: Excellent flow of contrast along the nerve, nerve root and into the epidural space.  Epidurogram: Contrast epidurogram showed no nerve root cut off or restricted flow pattern.  Procedure Details: After squaring off the sacral end-plate to get a true AP view, the C-arm was positioned so that the best possible view of the S1 foramen was visualized. The soft tissues overlying this structure were infiltrated with 2-3 ml. of 1% Lidocaine without Epinephrine.    The spinal needle was inserted toward the target using a "trajectory" view along the fluoroscope beam.  Under AP and lateral visualization, the needle was advanced so it did not puncture dura. Biplanar projections were used to confirm position. Aspiration was confirmed to be negative for CSF and/or blood. A 1-2 ml. volume of Isovue-250 was injected and flow of contrast was noted at each level. Radiographs were obtained for documentation purposes.   After attaining the desired flow of contrast documented above, a 0.5 to 1.0 ml test dose of 0.25% Marcaine was injected  into each respective transforaminal space.  The patient was observed for 90 seconds post injection.  After no sensory deficits were reported, and normal lower extremity motor function was noted,   the above injectate was administered so that equal amounts of the injectate were placed at each foramen (level) into the transforaminal epidural space.   Additional Comments:  No complications occurred Dressing: Band-Aid with 2 x 2 sterile gauze    Post-procedure details: Patient was observed during the procedure. Post-procedure instructions were reviewed.  Patient left the clinic in stable condition.

## 2022-07-26 NOTE — Progress Notes (Signed)
Kaitlyn Morrison - 67 y.o. female MRN [id number]  Date of birth: December 26, 1955  Office Visit Note: Visit Date: 07/19/2022 PCP: Benita Stabile, MD Referred by: Benita Stabile, MD  Subjective: Chief Complaint  Patient presents with   Lower Back - Pain   HPI:  Kaitlyn Morrison is a 66 y.o. female who comes in today at the request of Ellin Goodie, FNP for planned Bilateral S1-2 Lumbar Transforaminal epidural steroid injection with fluoroscopic guidance.  The patient has failed conservative care including home exercise, medications, time and activity modification.  This injection will be diagnostic and hopefully therapeutic.  Please see requesting physician notes for further details and justification.   ROS Otherwise per HPI.  Assessment & Plan: Visit Diagnoses:    ICD-10-CM   1. Lumbar radiculopathy  M54.16 XR C-ARM NO REPORT    Epidural Steroid injection    methylPREDNISolone acetate (DEPO-MEDROL) injection 80 mg      Plan: No additional findings.   Meds & Orders:  Meds ordered this encounter  Medications   methylPREDNISolone acetate (DEPO-MEDROL) injection 80 mg    Orders Placed This Encounter  Procedures   XR C-ARM NO REPORT   Epidural Steroid injection    Follow-up: Return for visit to requesting provider as needed.   Procedures: No procedures performed  S1 Lumbosacral Transforaminal Epidural Steroid Injection - Sub-Pedicular Approach with Fluoroscopic Guidance   Patient: Kaitlyn Morrison      Date of Birth: 06/11/1955 MRN: [id number] PCP: Benita Stabile, MD      Visit Date: 07/19/2022   Universal Protocol:    Date/Time: 06/27/248:43 PM  Consent Given By: the patient  Position:  PRONE  Additional Comments: Vital signs were monitored before and after the procedure. Patient was prepped and draped in the usual sterile fashion. The correct patient, procedure, and site was verified.   Injection Procedure Details:  Procedure Site One Meds  Administered:  Meds ordered this encounter  Medications   methylPREDNISolone acetate (DEPO-MEDROL) injection 80 mg    Laterality: Bilateral  Location/Site:  S1 Foramen   Needle size: 22 ga.  Needle type: Spinal  Needle Placement: Transforaminal  Findings:   -Comments: Excellent flow of contrast along the nerve, nerve root and into the epidural space.  Epidurogram: Contrast epidurogram showed no nerve root cut off or restricted flow pattern.  Procedure Details: After squaring off the sacral end-plate to get a true AP view, the C-arm was positioned so that the best possible view of the S1 foramen was visualized. The soft tissues overlying this structure were infiltrated with 2-3 ml. of 1% Lidocaine without Epinephrine.    The spinal needle was inserted toward the target using a "trajectory" view along the fluoroscope beam.  Under AP and lateral visualization, the needle was advanced so it did not puncture dura. Biplanar projections were used to confirm position. Aspiration was confirmed to be negative for CSF and/or blood. A 1-2 ml. volume of Isovue-250 was injected and flow of contrast was noted at each level. Radiographs were obtained for documentation purposes.   After attaining the desired flow of contrast documented above, a 0.5 to 1.0 ml test dose of 0.25% Marcaine was injected into each respective transforaminal space.  The patient was observed for 90 seconds post injection.  After no sensory deficits were reported, and normal lower extremity motor function was noted,   the above injectate was administered so that equal amounts of the injectate were placed at each foramen (level) into the  transforaminal epidural space.   Additional Comments:  No complications occurred Dressing: Band-Aid with 2 x 2 sterile gauze    Post-procedure details: Patient was observed during the procedure. Post-procedure instructions were reviewed.  Patient left the clinic in stable condition.    Clinical History: EXAM: MRI LUMBAR SPINE WITHOUT CONTRAST   TECHNIQUE: Multiplanar, multisequence MR imaging of the lumbar spine was performed. No intravenous contrast was administered.   COMPARISON:  MRI of lumbar spine 7/26/7. Lumbar spine radiographs 09/21/2021 ext field 5 non rib-bearing lumbar type vertebral bodies are present. The lowest fully formed vertebral body is L5.   FINDINGS: Segmentation: 5 non rib-bearing lumbar type vertebral bodies are present. The lowest fully formed vertebral body is L5.   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 Slight retrolisthesis is present at L4-5. No other significant listhesis is present.   Vertebrae: Chronic sclerotic endplate changes are associated with the listhesis at L5-S1. Marrow signal and vertebral body heights are otherwise normal.   Conus medullaris and cauda equina: Conus extends to the L1 level. Conus and cauda equina appear normal.   Paraspinal and other soft tissues: Limited imaging the abdomen is unremarkable. There is no significant adenopathy. No solid organ lesions are present.   Disc levels:   L1-2: Normal disc signal and height is present. No focal protrusion or stenosis is present.   L2-3: Normal disc signal and height is present. Mild disc bulging and facet hypertrophy is present without significant stenosis.   L3-4: Mild disc bulging is present. No significant protrusion or stenosis is present.   L4-5: A mild leftward disc protrusion is present. Mild facet hypertrophy is noted bilaterally. Mild left foraminal narrowing is present.   L5-S1: Uncovering of a broad-based disc protrusion is so seated with the listhesis. The central canal is patent. Moderate bilateral foraminal stenosis is slightly worse on the right with significant progression from the prior study.   IMPRESSION: 1. Grade 2 anterolisthesis at L5-S1 secondary to pars defects measures 11  mm. This may be even worse with flexion or extension. Flexion/extension radiographs could be used for further evaluation. 2. Moderate bilateral foraminal stenosis at L5-S1 is slightly worse on the right with significant progression from the prior study. 3. Mild left foraminal narrowing at L4-5. 4. Mild disc bulging and facet hypertrophy at L2-3 and L3-4 without significant stenosis.     Electronically Signed   By: Marin Roberts M.D.   On: 11/06/2021 06:20     Objective:  VS:  HT:    WT:   BMI:     BP:98/62  HR:74bpm  TEMP: ( )  RESP:  Physical Exam Vitals and nursing note reviewed.  Constitutional:      General: She is not in acute distress.    Appearance: Normal appearance. She is not ill-appearing.  HENT:     Head: Normocephalic and atraumatic.     Right Ear: External ear normal.     Left Ear: External ear normal.  Eyes:     Extraocular Movements: Extraocular movements intact.  Cardiovascular:     Rate and Rhythm: Normal rate.     Pulses: Normal pulses.  Pulmonary:     Effort: Pulmonary effort is normal. No respiratory distress.  Abdominal:     General: There is no distension.     Palpations: Abdomen is soft.  Musculoskeletal:        General: Tenderness present.     Cervical back: Neck supple.  Right lower leg: No edema.     Left lower leg: No edema.     Comments: Patient has good distal strength with no pain over the greater trochanters.  No clonus or focal weakness.  Skin:    Findings: No erythema, lesion or rash.  Neurological:     General: No focal deficit present.     Mental Status: She is alert and oriented to person, place, and time.     Sensory: No sensory deficit.     Motor: No weakness or abnormal muscle tone.     Coordination: Coordination normal.  Psychiatric:        Mood and Affect: Mood normal.        Behavior: Behavior normal.      Imaging: No results found.

## 2022-07-30 DIAGNOSIS — B079 Viral wart, unspecified: Secondary | ICD-10-CM | POA: Diagnosis not present

## 2022-08-15 ENCOUNTER — Other Ambulatory Visit: Payer: Self-pay

## 2022-10-04 DIAGNOSIS — G4700 Insomnia, unspecified: Secondary | ICD-10-CM | POA: Diagnosis not present

## 2022-10-04 DIAGNOSIS — M810 Age-related osteoporosis without current pathological fracture: Secondary | ICD-10-CM | POA: Diagnosis not present

## 2022-10-04 DIAGNOSIS — Z008 Encounter for other general examination: Secondary | ICD-10-CM | POA: Diagnosis not present

## 2022-10-04 DIAGNOSIS — F411 Generalized anxiety disorder: Secondary | ICD-10-CM | POA: Diagnosis not present

## 2022-10-04 DIAGNOSIS — Z6821 Body mass index (BMI) 21.0-21.9, adult: Secondary | ICD-10-CM | POA: Diagnosis not present

## 2022-10-04 DIAGNOSIS — M1990 Unspecified osteoarthritis, unspecified site: Secondary | ICD-10-CM | POA: Diagnosis not present

## 2022-10-04 DIAGNOSIS — F3341 Major depressive disorder, recurrent, in partial remission: Secondary | ICD-10-CM | POA: Diagnosis not present

## 2022-10-04 DIAGNOSIS — E785 Hyperlipidemia, unspecified: Secondary | ICD-10-CM | POA: Diagnosis not present

## 2022-10-09 DIAGNOSIS — L02828 Furuncle of other sites: Secondary | ICD-10-CM | POA: Diagnosis not present

## 2022-10-17 ENCOUNTER — Telehealth: Payer: No Typology Code available for payment source | Admitting: Physician Assistant

## 2022-10-17 DIAGNOSIS — J019 Acute sinusitis, unspecified: Secondary | ICD-10-CM

## 2022-10-17 DIAGNOSIS — B9789 Other viral agents as the cause of diseases classified elsewhere: Secondary | ICD-10-CM

## 2022-10-17 MED ORDER — PREDNISONE 20 MG PO TABS: 40.0000 mg | ORAL_TABLET | Freq: Every day | ORAL | 0 refills | Status: DC

## 2022-10-17 MED ORDER — ONDANSETRON 4 MG PO TBDP: 4.0000 mg | ORAL_TABLET | Freq: Three times a day (TID) | ORAL | 0 refills | Status: DC | PRN

## 2022-10-17 NOTE — Patient Instructions (Signed)
Bonnee Quin, thank you for joining Piedad Climes, PA-C for today's virtual visit.  While this provider is not your primary care provider (PCP), if your PCP is located in our provider database this encounter information will be shared with them immediately following your visit.   A [facility] MyChart account gives you access to today's visit and all your visits, tests, and labs performed at [facility] " click here if you don't have a [facility] MyChart account or go to [email or web address]  Consent: (Patient) Alanda Koeneman provided verbal consent for this virtual visit at the beginning of the encounter.  Current Medications:  Current Outpatient Medications:    atorvastatin (LIPITOR) 10 MG tablet, Take 10 mg by mouth at bedtime. , Disp: , Rfl:    Calcium Carbonate-Vit D-Min (CALTRATE 600+D PLUS MINERALS) 600-800 MG-UNIT CHEW, Chew 1 tablet by mouth daily., Disp: , Rfl:    diazepam (VALIUM) 5 MG tablet, Take one tablet by mouth with food one hour prior to procedure. May repeat 30 minutes prior if needed., Disp: 2 tablet, Rfl: 0   escitalopram (LEXAPRO) 5 MG tablet, Take 5 mg by mouth daily., Disp: , Rfl:    fluticasone (FLONASE) 50 MCG/ACT nasal spray, Place 2 sprays into both nostrils daily., Disp: , Rfl:    LORazepam (ATIVAN) 0.5 MG tablet, Take 0.5 mg by mouth at bedtime., Disp: , Rfl:    meloxicam (MOBIC) 7.5 MG tablet, TAKE 1 TABLET DAILY IN THE EVENING, Disp: 90 tablet, Rfl: 3   methocarbamol (ROBAXIN) 500 MG tablet, TAKE 1 TABLET BY MOUTH THREE TIMES A DAY, Disp: 60 tablet, Rfl: 5   montelukast (SINGULAIR) 10 MG tablet, Take 10 mg by mouth at bedtime. , Disp: , Rfl:    Multiple Vitamin (MULTIVITAMIN WITH MINERALS) TABS tablet, Take 1 tablet by mouth daily., Disp: , Rfl:    oxyCODONE-acetaminophen (PERCOCET/ROXICET) 5-325 MG tablet, Take 1 tablet by mouth every 4 (four) hours as needed for severe pain or moderate pain., Disp: 30 tablet, Rfl: 0    PROLIA 60 MG/ML SOSY injection, Inject 60 mg into the skin every 6 (six) months., Disp: , Rfl:    Turmeric 500 MG CAPS, Take 1,000 mg by mouth 2 (two) times daily., Disp: , Rfl:    VITAMIN D PO, Take 10,000 Units by mouth daily., Disp: , Rfl:    Medications ordered in this encounter:  No orders of the defined types were placed in this encounter.    *If you need refills on other medications prior to your next appointment, please contact your pharmacy*  Follow-Up: Call back or seek an in-person evaluation if the symptoms worsen or if the condition fails to improve as anticipated.  [facility] [phone]  Other Instructions Please hydrate and rest. Continue your routine allergy medications. Take the prednisone as directed. The zofran is to use as directed, if needed for nausea/vomiting. Start exercises below. If not resolving or anything worsening, you need an in person evaluation.   How to Perform the Epley Maneuver The Epley maneuver is an exercise that relieves symptoms of vertigo. Vertigo is the feeling that you or your surroundings are moving when they are not. When you feel vertigo, you may feel like the room is spinning and may have trouble walking. The Epley maneuver is used for a type of vertigo caused by a calcium deposit in a part of the inner ear. The maneuver involves changing head positions to help the deposit move out of the  area. You can do this maneuver at home whenever you have symptoms of vertigo. You can repeat it in 24 hours if your vertigo has not gone away. Even though the Epley maneuver may relieve your vertigo for a few weeks, it is possible that your symptoms will return. This maneuver relieves vertigo, but it does not relieve dizziness. What are the risks? If it is done correctly, the Epley maneuver is considered safe. Sometimes it can lead to dizziness or nausea that goes away after a short time. If you develop other symptoms--such as changes in  vision, weakness, or numbness--stop doing the maneuver and call your health care provider. Supplies needed: A bed or table. A pillow. How to do the Epley maneuver     Sit on the edge of a bed or table with your back straight and your legs extended or hanging over the edge of the bed or table. Turn your head halfway toward the affected ear or side as told by your health care provider. Lie backward quickly with your head turned until you are lying flat on your back. Your head should dangle (head-hanging position). You may want to position a pillow under your shoulders. Hold this position for at least 30 seconds. If you feel dizzy or have symptoms of vertigo, continue to hold the position until the symptoms stop. Turn your head to the opposite direction until your unaffected ear is facing down. Your head should continue to dangle. Hold this position for at least 30 seconds. If you feel dizzy or have symptoms of vertigo, continue to hold the position until the symptoms stop. Turn your whole body to the same side as your head so that you are positioned on your side. Your head will now be nearly facedown and no longer needs to dangle. Hold for at least 30 seconds. If you feel dizzy or have symptoms of vertigo, continue to hold the position until the symptoms stop. Sit back up. You can repeat the maneuver in 24 hours if your vertigo does not go away. Follow these instructions at home: For 24 hours after doing the Epley maneuver: Keep your head in an upright position. When lying down to sleep or rest, keep your head raised (elevated) with two or more pillows. Avoid excessive neck movements. Activity Do not drive or use machinery if you feel dizzy. After doing the Epley maneuver, return to your normal activities as told by your health care provider. Ask your health care provider what activities are safe for you. General instructions Drink enough fluid to keep your urine pale yellow. Do not drink  alcohol. Take over-the-counter and prescription medicines only as told by your health care provider. Keep all follow-up visits. This is important. Preventing vertigo symptoms Ask your health care provider if there is anything you should do at home to prevent vertigo. He or she may recommend that you: Keep your head elevated with two or more pillows while you sleep. Do not sleep on the side of your affected ear. Get up slowly from bed. Avoid sudden movements during the day. Avoid extreme head positions or movement, such as looking up or bending over. Contact a health care provider if: Your vertigo gets worse. You have other symptoms, including: Nausea. Vomiting. Headache. Get help right away if you: Have vision changes. Have a headache or neck pain that is severe or getting worse. Cannot stop vomiting. Have new numbness or weakness in any part of your body. These symptoms may represent a serious problem that  is an emergency. Do not wait to see if the symptoms will go away. Get medical help right away. Call your local emergency services (911 in the [place] Do not drive yourself to the hospital. Summary Vertigo is the feeling that you or your surroundings are moving when they are not. The Epley maneuver is an exercise that relieves symptoms of vertigo. If the Epley maneuver is done correctly, it is considered safe. This information is not intended to replace advice given to you by your health care provider. Make sure you discuss any questions you have with your health care provider. Document Revised: 12/16/2019 Document Reviewed: 12/16/2019 Elsevier Patient Education  2024 Elsevier Inc.     If you have been instructed to have an in-person evaluation today at a local Urgent Care facility, please use the link below. It will take you to a list of all of our available [facility], including address, phone number and hours of operation. Please do not delay care.  [facility]  If you or a family member do not have a primary care provider, use the link below to schedule a visit and establish care. When you choose a [facility] primary care physician or advanced practice provider, you gain a long-term partner in health. Find a Primary Care Provider  Learn more about [facility]'s in-office and virtual care options: [facility] - Get Care Now

## 2022-10-17 NOTE — Progress Notes (Signed)
Virtual Visit Consent   [name], you are scheduled for a virtual visit with a [facility] provider today. Just as with appointments in the office, your consent must be obtained to participate. Your consent will be active for this visit and any virtual visit you may have with one of our providers in the next 365 days. If you have a MyChart account, a copy of this consent can be sent to you electronically.  As this is a virtual visit, video technology does not allow for your provider to perform a traditional examination. This may limit your provider's ability to fully assess your condition. If your provider identifies any concerns that need to be evaluated in person or the need to arrange testing (such as labs, EKG, etc.), we will make arrangements to do so. Although advances in technology are sophisticated, we cannot ensure that it will always work on either your end or our end. If the connection with a video visit is poor, the visit may have to be switched to a telephone visit. With either a video or telephone visit, we are not always able to ensure that we have a secure connection.  By engaging in this virtual visit, you consent to the provision of healthcare and authorize for your insurance to be billed (if applicable) for the services provided during this visit. Depending on your insurance coverage, you may receive a charge related to this service.  I need to obtain your verbal consent now. Are you willing to proceed with your visit today? Kaitlyn Morrison has provided verbal consent on 10/17/2022 for a virtual visit (video or telephone). Piedad Climes, [place]  Date: 10/17/2022 4:16 PM  Virtual Visit via Video Note   I, Piedad Climes, connected with  Kaitlyn Morrison  ([id number], 10-06-1955) on 10/17/22 at  4:00 PM EDT by a video-enabled telemedicine application and verified that I am speaking with the correct person using two identifiers.  Location: Patient:  Virtual Visit Location Patient: Home Provider: Virtual Visit Location Provider: Home Office   I discussed the limitations of evaluation and management by telemedicine and the availability of in person appointments. The patient expressed understanding and agreed to proceed.    History of Present Illness: Kaitlyn Morrison is a 67 y.o. who identifies as a female who was assigned female at birth, and is being seen today for nasal congestion, sinus pressure and chest tightness for 1.5 days, now with positional dizziness first noted a couple of hours ago. Is fine when resting, but quick movements of head make her feel off balance. Denies lightheadedness, vision changes, AMS. Denies fever, chills. Denies sinus pain or tooth pain. Denies recent travel or known sick contact. Is finishing Bactrim for a skin abscess with 3 more doses. Notes abscess has resolved.    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   Iron deficiency anemia 07/19/2010   Pulmonary embolism (HCC) 07/19/2010   Tendonitis of elbow, right    DERANGEMENT OF POSTERIOR HORN OF MEDIAL MENISCUS 02/27/2010   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   ILIOTIBIAL  BAND SYNDROME, LEFT KNEE 02/27/2010    Allergies:  Allergies  Allergen Reactions   Ethinyl Estradiol     Developed Pulmonary Embolus while on this med   Iodinated Contrast Media Swelling and Nausea Only   Iohexol      Desc: FACIAL SWELLING, STOMACH CRAMPS, PT NEEDS 13 HR PRE MEDICATION    Medications:  Current Outpatient Medications:     ondansetron (ZOFRAN-ODT) 4 MG disintegrating tablet, Take 1 tablet (4 mg total) by mouth every 8 (eight) hours as needed for nausea or vomiting., Disp: 20 tablet, Rfl: 0   predniSONE (DELTASONE) 20 MG tablet, Take 2 tablets (40 mg total) by mouth daily with breakfast., Disp: 10 tablet, Rfl: 0   sulfamethoxazole-trimethoprim (BACTRIM DS) 800-160 MG tablet, Take 1 tablet by mouth 2 (two) times daily., Disp: , Rfl:    atorvastatin (LIPITOR) 10 MG tablet, Take 10 mg by mouth at bedtime. , Disp: , Rfl:    Calcium Carbonate-Vit D-Min (CALTRATE 600+D PLUS MINERALS) 600-800 MG-UNIT CHEW, Chew 1 tablet by mouth daily., Disp: , Rfl:    escitalopram (LEXAPRO) 5 MG tablet, Take 5 mg by mouth daily., Disp: , Rfl:    fluticasone (FLONASE) 50 MCG/ACT nasal spray, Place 2 sprays into both nostrils daily., Disp: , Rfl:    LORazepam (ATIVAN) 0.5 MG tablet, Take 0.5 mg by mouth at bedtime., Disp: , Rfl:    montelukast (SINGULAIR) 10 MG tablet, Take 10 mg by mouth at bedtime. , Disp: , Rfl:    Multiple Vitamin (MULTIVITAMIN WITH MINERALS) TABS tablet, Take 1 tablet by mouth daily., Disp: , Rfl:    PROLIA 60 MG/ML SOSY injection, Inject 60 mg into the skin every 6 (six) months., Disp: , Rfl:    Turmeric 500 MG CAPS, Take 1,000 mg by mouth 2 (two) times daily., Disp: , Rfl:    VITAMIN D PO, Take 10,000 Units by mouth daily., Disp: , Rfl:   Observations/Objective: Patient is well-developed, well-nourished in no acute distress.  Resting comfortably at home.  Head is normocephalic, atraumatic.  No labored breathing. Speech is clear and coherent with logical content.  Patient is alert and oriented at baseline.   Assessment and Plan: 1. Acute viral sinusitis - ondansetron (ZOFRAN-ODT) 4 MG disintegrating tablet; Take 1 tablet (4 mg total) by mouth every 8 (eight) hours as needed for nausea or vomiting.  Dispense: 20 tablet; Refill: 0 - predniSONE (DELTASONE) 20 MG tablet; Take 2 tablets (40 mg total) by mouth  daily with breakfast.  Dispense: 10 tablet; Refill: 0  Supportive measures and OTC medications reviewed. Will have her COVID test as a precaution. Start Zofran and Prednisone per orders. Continue Singulair and Flonase. Can use her diazepam as directed to further help with positional dizziness. Epley maneuvers reviewed. Strict in person evaluation precautions reviewed.   Follow Up Instructions: I discussed the assessment and treatment plan with the patient. The patient was provided an opportunity to ask questions and all were answered. The patient agreed with the plan and demonstrated an understanding of the instructions.  A copy of instructions were sent to the patient via MyChart unless otherwise noted below.   The patient was advised to call back or seek an in-person evaluation if the symptoms worsen or if the condition fails to improve as anticipated.  Time:  I spent 10 minutes with the patient via telehealth technology discussing the above problems/concerns.    Piedad Climes, PA-C

## 2022-11-12 DIAGNOSIS — L821 Other seborrheic keratosis: Secondary | ICD-10-CM | POA: Diagnosis not present

## 2022-11-12 DIAGNOSIS — B079 Viral wart, unspecified: Secondary | ICD-10-CM | POA: Diagnosis not present

## 2022-11-12 DIAGNOSIS — L280 Lichen simplex chronicus: Secondary | ICD-10-CM | POA: Diagnosis not present

## 2022-12-03 ENCOUNTER — Other Ambulatory Visit: Payer: Self-pay | Admitting: Physical Medicine and Rehabilitation

## 2022-12-03 ENCOUNTER — Other Ambulatory Visit: Payer: Self-pay | Admitting: Orthopedic Surgery

## 2022-12-03 DIAGNOSIS — M4316 Spondylolisthesis, lumbar region: Secondary | ICD-10-CM

## 2022-12-03 DIAGNOSIS — M5441 Lumbago with sciatica, right side: Secondary | ICD-10-CM

## 2022-12-03 DIAGNOSIS — M4306 Spondylolysis, lumbar region: Secondary | ICD-10-CM

## 2022-12-03 MED ORDER — DIAZEPAM 5 MG PO TABS: 5.0000 mg | ORAL_TABLET | Freq: Four times a day (QID) | ORAL | 0 refills | Status: AC | PRN

## 2022-12-03 NOTE — Progress Notes (Signed)
67 year old female with acute onset of lower back pain request prescription for Valium which has worked well for her in the past  She does have significant spinal stenosis and has intermittent episodes of back pain usually relieved with Valium for 5 days  Requested Prescriptions   Signed Prescriptions Disp Refills   diazepam (VALIUM) 5 MG tablet 20 tablet 0    Sig: Take 1 tablet (5 mg total) by mouth every 6 (six) hours as needed for up to 5 days for muscle spasms.

## 2022-12-05 ENCOUNTER — Telehealth: Payer: Self-pay | Admitting: Physical Medicine and Rehabilitation

## 2022-12-05 DIAGNOSIS — E782 Mixed hyperlipidemia: Secondary | ICD-10-CM | POA: Diagnosis not present

## 2022-12-05 DIAGNOSIS — R718 Other abnormality of red blood cells: Secondary | ICD-10-CM | POA: Diagnosis not present

## 2022-12-05 DIAGNOSIS — M810 Age-related osteoporosis without current pathological fracture: Secondary | ICD-10-CM | POA: Diagnosis not present

## 2022-12-05 NOTE — Telephone Encounter (Signed)
Pt called requesting an appt for back injection. Please call pt at [phone]. Lat injection 06/2022

## 2022-12-07 ENCOUNTER — Other Ambulatory Visit: Payer: Self-pay | Admitting: Physical Medicine and Rehabilitation

## 2022-12-07 DIAGNOSIS — M4306 Spondylolysis, lumbar region: Secondary | ICD-10-CM

## 2022-12-07 DIAGNOSIS — M4316 Spondylolisthesis, lumbar region: Secondary | ICD-10-CM

## 2022-12-07 DIAGNOSIS — M5416 Radiculopathy, lumbar region: Secondary | ICD-10-CM

## 2022-12-10 ENCOUNTER — Telehealth: Payer: Self-pay

## 2022-12-10 NOTE — Telephone Encounter (Signed)
I called the patient to get a little more information regarding her last epidural steroid injection (bilateral transforaminal) on 07/19/2022. The patient stated she had about 80% improvement in her pain and daily functioning. This lasted about 4 & 1/2 months.

## 2022-12-11 DIAGNOSIS — M5442 Lumbago with sciatica, left side: Secondary | ICD-10-CM | POA: Diagnosis not present

## 2022-12-11 DIAGNOSIS — M5441 Lumbago with sciatica, right side: Secondary | ICD-10-CM | POA: Diagnosis not present

## 2022-12-11 DIAGNOSIS — M810 Age-related osteoporosis without current pathological fracture: Secondary | ICD-10-CM | POA: Diagnosis not present

## 2022-12-11 DIAGNOSIS — Z23 Encounter for immunization: Secondary | ICD-10-CM | POA: Diagnosis not present

## 2022-12-11 DIAGNOSIS — M4317 Spondylolisthesis, lumbosacral region: Secondary | ICD-10-CM | POA: Diagnosis not present

## 2022-12-11 DIAGNOSIS — J302 Other seasonal allergic rhinitis: Secondary | ICD-10-CM | POA: Diagnosis not present

## 2022-12-11 DIAGNOSIS — Z0000 Encounter for general adult medical examination without abnormal findings: Secondary | ICD-10-CM | POA: Diagnosis not present

## 2022-12-11 DIAGNOSIS — E782 Mixed hyperlipidemia: Secondary | ICD-10-CM | POA: Diagnosis not present

## 2022-12-11 DIAGNOSIS — G4700 Insomnia, unspecified: Secondary | ICD-10-CM | POA: Diagnosis not present

## 2022-12-11 DIAGNOSIS — F329 Major depressive disorder, single episode, unspecified: Secondary | ICD-10-CM | POA: Diagnosis not present

## 2022-12-11 DIAGNOSIS — M25561 Pain in right knee: Secondary | ICD-10-CM | POA: Diagnosis not present

## 2022-12-11 DIAGNOSIS — M189 Osteoarthritis of first carpometacarpal joint, unspecified: Secondary | ICD-10-CM | POA: Diagnosis not present

## 2022-12-14 ENCOUNTER — Other Ambulatory Visit: Payer: Self-pay | Admitting: Physical Medicine and Rehabilitation

## 2022-12-14 MED ORDER — DIAZEPAM 5 MG PO TABS: ORAL_TABLET | ORAL | 0 refills | Status: DC

## 2022-12-20 ENCOUNTER — Other Ambulatory Visit: Payer: Self-pay

## 2022-12-20 ENCOUNTER — Ambulatory Visit: Payer: No Typology Code available for payment source | Admitting: Physical Medicine and Rehabilitation

## 2022-12-20 DIAGNOSIS — M5416 Radiculopathy, lumbar region: Secondary | ICD-10-CM | POA: Diagnosis not present

## 2022-12-20 MED ORDER — METHYLPREDNISOLONE ACETATE 40 MG/ML IJ SUSP
40.0000 mg | Freq: Once | INTRAMUSCULAR | Status: AC
  Administered 2022-12-20: 40 mg

## 2022-12-20 NOTE — Patient Instructions (Signed)

## 2022-12-20 NOTE — Progress Notes (Signed)
Functional Pain Scale - descriptive words and definitions  Moderate (4)   Constantly aware of pain, can complete ADLs with modification/sleep marginally affected at times/passive distraction is of no use, but active distraction gives some relief. Moderate range order  Average Pain 4  112/72 +Driver, -BT, -Dye Allergies.

## 2022-12-26 NOTE — Progress Notes (Signed)
Kaitlyn Morrison - 67 y.o. female MRN [id number]  Date of birth: March 09, 1955  Office Visit Note: Visit Date: 12/20/2022 PCP: Benita Stabile, MD Referred by: Benita Stabile, MD  Subjective: Chief Complaint  Patient presents with   Lower Back - Pain   HPI:  Kaitlyn Morrison is a 67 y.o. female who comes in today for planned repeat Bilateral S1  Lumbar Transforaminal epidural steroid injection with fluoroscopic guidance.  The patient has failed conservative care including home exercise, medications, time and activity modification.  This injection will be diagnostic and hopefully therapeutic.  Please see requesting physician notes for further details and justification. Patient received more than 50% pain relief from prior injection.   Referring: Ellin Goodie, FNP   ROS Otherwise per HPI.  Assessment & Plan: Visit Diagnoses:    ICD-10-CM   1. Lumbar radiculopathy  M54.16 XR C-ARM NO REPORT    Epidural Steroid injection    methylPREDNISolone acetate (DEPO-MEDROL) injection 40 mg      Plan: No additional findings.   Meds & Orders:  Meds ordered this encounter  Medications   methylPREDNISolone acetate (DEPO-MEDROL) injection 40 mg    Orders Placed This Encounter  Procedures   XR C-ARM NO REPORT   Epidural Steroid injection    Follow-up: Return for visit to requesting provider as needed.   Procedures: No procedures performed  S1 Lumbosacral Transforaminal Epidural Steroid Injection - Sub-Pedicular Approach with Fluoroscopic Guidance   Patient: Kaitlyn Morrison      Date of Birth: [birth year] MRN: [id number] PCP: Benita Stabile, MD      Visit Date: 12/20/2022   Universal Protocol:    Date/Time: 11/27/241:52 PM  Consent Given By: the patient  Position:  PRONE  Additional Comments: Vital signs were monitored before and after the procedure. Patient was prepped and draped in the usual sterile fashion. The correct patient, procedure, and site was  verified.   Injection Procedure Details:  Procedure Site One Meds Administered:  Meds ordered this encounter  Medications   methylPREDNISolone acetate (DEPO-MEDROL) injection 40 mg    Laterality: Bilateral  Location/Site:  S1 Foramen  Grade 2 Listhesis, hard to visualize the S1 foramen but did have good flow of contrast  Needle size: 22 ga.  Needle type: Spinal  Needle Placement: Transforaminal  Findings:   -Comments: Excellent flow of contrast along the nerve, nerve root and into the epidural space.  Epidurogram: Contrast epidurogram showed no nerve root cut off or restricted flow pattern.  Procedure Details: After squaring off the sacral end-plate to get a true AP view, the C-arm was positioned so that the best possible view of the S1 foramen was visualized. The soft tissues overlying this structure were infiltrated with 2-3 ml. of 1% Lidocaine without Epinephrine.    The spinal needle was inserted toward the target using a "trajectory" view along the fluoroscope beam.  Under AP and lateral visualization, the needle was advanced so it did not puncture dura. Biplanar projections were used to confirm position. Aspiration was confirmed to be negative for CSF and/or blood. A 1-2 ml. volume of Isovue-250 was injected and flow of contrast was noted at each level. Radiographs were obtained for documentation purposes.   After attaining the desired flow of contrast documented above, a 0.5 to 1.0 ml test dose of 0.25% Marcaine was injected into each respective transforaminal space.  The patient was observed for 90 seconds post injection.  After no sensory deficits were reported, and normal lower  extremity motor function was noted,   the above injectate was administered so that equal amounts of the injectate were placed at each foramen (level) into the transforaminal epidural space.   Additional Comments:  No complications occurred Dressing: Band-Aid with 2 x 2 sterile gauze     Post-procedure details: Patient was observed during the procedure. Post-procedure instructions were reviewed.  Patient left the clinic in stable condition.   Clinical History: EXAM: MRI LUMBAR SPINE WITHOUT CONTRAST   TECHNIQUE: Multiplanar, multisequence MR imaging of the lumbar spine was performed. No intravenous contrast was administered.   COMPARISON:  MRI of lumbar spine 7/26/7. Lumbar spine radiographs 09/21/2021 ext field 5 non rib-bearing lumbar type vertebral bodies are present. The lowest fully formed vertebral body is L5.   FINDINGS: Segmentation: 5 non rib-bearing lumbar type vertebral bodies are present. The lowest fully formed vertebral body is L5.   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 Slight retrolisthesis is present at L4-5. No other significant listhesis is present.   Vertebrae: Chronic sclerotic endplate changes are associated with the listhesis at L5-S1. Marrow signal and vertebral body heights are otherwise normal.   Conus medullaris and cauda equina: Conus extends to the L1 level. Conus and cauda equina appear normal.   Paraspinal and other soft tissues: Limited imaging the abdomen is unremarkable. There is no significant adenopathy. No solid organ lesions are present.   Disc levels:   L1-2: Normal disc signal and height is present. No focal protrusion or stenosis is present.   L2-3: Normal disc signal and height is present. Mild disc bulging and facet hypertrophy is present without significant stenosis.   L3-4: Mild disc bulging is present. No significant protrusion or stenosis is present.   L4-5: A mild leftward disc protrusion is present. Mild facet hypertrophy is noted bilaterally. Mild left foraminal narrowing is present.   L5-S1: Uncovering of a broad-based disc protrusion is so seated with the listhesis. The central canal is patent. Moderate bilateral foraminal stenosis is slightly  worse on the right with significant progression from the prior study.   IMPRESSION: 1. 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 This may be even worse with flexion or extension. Flexion/extension radiographs could be used for further evaluation. 2. Moderate bilateral foraminal stenosis at L5-S1 is slightly worse on the right with significant progression from the prior study. 3. Mild left foraminal narrowing at L4-5. 4. Mild disc bulging and facet hypertrophy at L2-3 and L3-4 without significant stenosis.     Electronically Signed   By: Marin Roberts M.D.   On: 11/06/2021 06:20     Objective:  VS:  HT:    WT:   BMI:     BP:   HR: bpm  TEMP: ( )  RESP:  Physical Exam Vitals and nursing note reviewed.  Constitutional:      General: She is not in acute distress.    Appearance: Normal appearance. She is not ill-appearing.  HENT:     Head: Normocephalic and atraumatic.     Right Ear: External ear normal.     Left Ear: External ear normal.  Eyes:     Extraocular Movements: Extraocular movements intact.  Cardiovascular:     Rate and Rhythm: Normal rate.     Pulses: Normal pulses.  Pulmonary:     Effort: Pulmonary effort is normal. No respiratory distress.  Abdominal:     General: There is no distension.  Palpations: Abdomen is soft.  Musculoskeletal:        General: Tenderness present.     Cervical back: Neck supple.     Right lower leg: No edema.     Left lower leg: No edema.     Comments: Patient has good distal strength with no pain over the greater trochanters.  No clonus or focal weakness.  Skin:    Findings: No erythema, lesion or rash.  Neurological:     General: No focal deficit present.     Mental Status: She is alert and oriented to person, place, and time.     Sensory: No sensory deficit.     Motor: No weakness or abnormal muscle tone.     Coordination: Coordination normal.  Psychiatric:        Mood and  Affect: Mood normal.        Behavior: Behavior normal.      Imaging: No results found.

## 2022-12-26 NOTE — Procedures (Signed)
S1 Lumbosacral Transforaminal Epidural Steroid Injection - Sub-Pedicular Approach with Fluoroscopic Guidance   Patient: Kaitlyn Morrison      Date of Birth: 09-05-1955 MRN: [id number] PCP: Benita Stabile, MD      Visit Date: 12/20/2022   Universal Protocol:    Date/Time: 11/27/241:52 PM  Consent Given By: the patient  Position:  PRONE  Additional Comments: Vital signs were monitored before and after the procedure. Patient was prepped and draped in the usual sterile fashion. The correct patient, procedure, and site was verified.   Injection Procedure Details:  Procedure Site One Meds Administered:  Meds ordered this encounter  Medications   methylPREDNISolone acetate (DEPO-MEDROL) injection 40 mg    Laterality: Bilateral  Location/Site:  S1 Foramen  Grade 2 Listhesis, hard to visualize the S1 foramen but did have good flow of contrast  Needle size: 22 ga.  Needle type: Spinal  Needle Placement: Transforaminal  Findings:   -Comments: Excellent flow of contrast along the nerve, nerve root and into the epidural space.  Epidurogram: Contrast epidurogram showed no nerve root cut off or restricted flow pattern.  Procedure Details: After squaring off the sacral end-plate to get a true AP view, the C-arm was positioned so that the best possible view of the S1 foramen was visualized. The soft tissues overlying this structure were infiltrated with 2-3 ml. of 1% Lidocaine without Epinephrine.    The spinal needle was inserted toward the target using a "trajectory" view along the fluoroscope beam.  Under AP and lateral visualization, the needle was advanced so it did not puncture dura. Biplanar projections were used to confirm position. Aspiration was confirmed to be negative for CSF and/or blood. A 1-2 ml. volume of Isovue-250 was injected and flow of contrast was noted at each level. Radiographs were obtained for documentation purposes.   After attaining the desired flow of  contrast documented above, a 0.5 to 1.0 ml test dose of 0.25% Marcaine was injected into each respective transforaminal space.  The patient was observed for 90 seconds post injection.  After no sensory deficits were reported, and normal lower extremity motor function was noted,   the above injectate was administered so that equal amounts of the injectate were placed at each foramen (level) into the transforaminal epidural space.   Additional Comments:  No complications occurred Dressing: Band-Aid with 2 x 2 sterile gauze    Post-procedure details: Patient was observed during the procedure. Post-procedure instructions were reviewed.  Patient left the clinic in stable condition.

## 2023-01-12 ENCOUNTER — Other Ambulatory Visit: Payer: Self-pay | Admitting: Orthopedic Surgery

## 2023-01-12 DIAGNOSIS — G8929 Other chronic pain: Secondary | ICD-10-CM

## 2023-02-05 DIAGNOSIS — M810 Age-related osteoporosis without current pathological fracture: Secondary | ICD-10-CM | POA: Diagnosis not present

## 2023-02-13 ENCOUNTER — Other Ambulatory Visit: Payer: Self-pay | Admitting: Medical Genetics

## 2023-02-15 ENCOUNTER — Other Ambulatory Visit (HOSPITAL_COMMUNITY)
Admission: RE | Admit: 2023-02-15 | Discharge: 2023-02-15 | Disposition: A | Discharge status: Home / Self Care | Payer: Self-pay | Source: Ambulatory Visit | Attending: Oncology | Admitting: Oncology

## 2023-02-28 LAB — GENECONNECT MOLECULAR SCREEN: Genetic Analysis Overall Interpretation: NEGATIVE

## 2023-03-07 ENCOUNTER — Ambulatory Visit: Payer: No Typology Code available for payment source | Admitting: Orthopedic Surgery

## 2023-03-07 DIAGNOSIS — G8929 Other chronic pain: Secondary | ICD-10-CM

## 2023-03-07 DIAGNOSIS — M2241 Chondromalacia patellae, right knee: Secondary | ICD-10-CM

## 2023-03-07 DIAGNOSIS — M25561 Pain in right knee: Secondary | ICD-10-CM | POA: Diagnosis not present

## 2023-03-07 MED ORDER — METHYLPREDNISOLONE ACETATE 40 MG/ML IJ SUSP
40.0000 mg | Freq: Once | INTRAMUSCULAR | Status: AC
  Administered 2023-03-07: 40 mg via INTRA_ARTICULAR

## 2023-03-07 NOTE — Progress Notes (Signed)
 Chief Complaint  Patient presents with   Injections    Right knee injection   Encounter Diagnoses  Name Primary?   Chronic pain of right knee Yes   Chondromalacia of right patella    Kaitlyn Morrison has pain in the right knee at the inferior pole of the patella and feels like something is slipping in the knee  No history of trauma  Review of systems right foot surgery nonunion fifth metatarsal ORIF doing well  Exam shows tenderness at the inferior pole the patella with crepitance on initial engagement of the patella and the trochlea at approximately 20 to 30 degrees of flexion  The knee looks fine for injection    Procedure note right knee injection   verbal consent was obtained to inject right knee joint  Timeout was completed to confirm the site of injection  The medications used were depomedrol 40 mg and 1% lidocaine  3 cc Anesthesia was provided by ethyl chloride and the skin was prepped with alcohol.  After cleaning the skin with alcohol a 20-gauge needle was used to inject the right knee joint. There were no complications. A sterile bandage was applied.   Return as needed

## 2023-03-13 DIAGNOSIS — B079 Viral wart, unspecified: Secondary | ICD-10-CM | POA: Diagnosis not present

## 2023-04-08 DIAGNOSIS — Z008 Encounter for other general examination: Secondary | ICD-10-CM | POA: Diagnosis not present

## 2023-04-08 DIAGNOSIS — M545 Low back pain, unspecified: Secondary | ICD-10-CM | POA: Diagnosis not present

## 2023-04-08 DIAGNOSIS — G4700 Insomnia, unspecified: Secondary | ICD-10-CM | POA: Diagnosis not present

## 2023-04-08 DIAGNOSIS — E785 Hyperlipidemia, unspecified: Secondary | ICD-10-CM | POA: Diagnosis not present

## 2023-04-08 DIAGNOSIS — F411 Generalized anxiety disorder: Secondary | ICD-10-CM | POA: Diagnosis not present

## 2023-04-08 DIAGNOSIS — F325 Major depressive disorder, single episode, in full remission: Secondary | ICD-10-CM | POA: Diagnosis not present

## 2023-04-08 DIAGNOSIS — M810 Age-related osteoporosis without current pathological fracture: Secondary | ICD-10-CM | POA: Diagnosis not present

## 2023-05-02 DIAGNOSIS — H52223 Regular astigmatism, bilateral: Secondary | ICD-10-CM | POA: Diagnosis not present

## 2023-05-29 DIAGNOSIS — R109 Unspecified abdominal pain: Secondary | ICD-10-CM | POA: Insufficient documentation

## 2023-05-29 DIAGNOSIS — K5792 Diverticulitis of intestine, part unspecified, without perforation or abscess without bleeding: Secondary | ICD-10-CM | POA: Diagnosis not present

## 2023-06-04 DIAGNOSIS — E782 Mixed hyperlipidemia: Secondary | ICD-10-CM | POA: Diagnosis not present

## 2023-06-04 DIAGNOSIS — M810 Age-related osteoporosis without current pathological fracture: Secondary | ICD-10-CM | POA: Diagnosis not present

## 2023-06-10 ENCOUNTER — Other Ambulatory Visit (HOSPITAL_COMMUNITY): Payer: Self-pay | Admitting: Internal Medicine

## 2023-06-10 DIAGNOSIS — M4317 Spondylolisthesis, lumbosacral region: Secondary | ICD-10-CM | POA: Diagnosis not present

## 2023-06-10 DIAGNOSIS — E782 Mixed hyperlipidemia: Secondary | ICD-10-CM | POA: Diagnosis not present

## 2023-06-10 DIAGNOSIS — G4700 Insomnia, unspecified: Secondary | ICD-10-CM | POA: Diagnosis not present

## 2023-06-10 DIAGNOSIS — Z853 Personal history of malignant neoplasm of breast: Secondary | ICD-10-CM | POA: Diagnosis not present

## 2023-06-10 DIAGNOSIS — M189 Osteoarthritis of first carpometacarpal joint, unspecified: Secondary | ICD-10-CM | POA: Diagnosis not present

## 2023-06-10 DIAGNOSIS — Z860100 Personal history of colon polyps, unspecified: Secondary | ICD-10-CM | POA: Diagnosis not present

## 2023-06-10 DIAGNOSIS — Z1231 Encounter for screening mammogram for malignant neoplasm of breast: Secondary | ICD-10-CM

## 2023-06-10 DIAGNOSIS — M810 Age-related osteoporosis without current pathological fracture: Secondary | ICD-10-CM | POA: Diagnosis not present

## 2023-06-10 DIAGNOSIS — M5441 Lumbago with sciatica, right side: Secondary | ICD-10-CM | POA: Diagnosis not present

## 2023-06-10 DIAGNOSIS — F329 Major depressive disorder, single episode, unspecified: Secondary | ICD-10-CM | POA: Diagnosis not present

## 2023-06-10 DIAGNOSIS — M25561 Pain in right knee: Secondary | ICD-10-CM | POA: Diagnosis not present

## 2023-06-10 DIAGNOSIS — J302 Other seasonal allergic rhinitis: Secondary | ICD-10-CM | POA: Diagnosis not present

## 2023-06-10 DIAGNOSIS — R000 Tachycardia, unspecified: Secondary | ICD-10-CM | POA: Diagnosis not present

## 2023-06-12 ENCOUNTER — Other Ambulatory Visit: Payer: Self-pay | Admitting: Internal Medicine

## 2023-06-12 DIAGNOSIS — M810 Age-related osteoporosis without current pathological fracture: Secondary | ICD-10-CM

## 2023-06-17 ENCOUNTER — Other Ambulatory Visit: Payer: Self-pay | Admitting: Internal Medicine

## 2023-06-17 ENCOUNTER — Ambulatory Visit: Attending: Internal Medicine

## 2023-06-17 ENCOUNTER — Ambulatory Visit: Attending: Internal Medicine | Admitting: Internal Medicine

## 2023-06-17 ENCOUNTER — Telehealth: Payer: Self-pay | Admitting: Internal Medicine

## 2023-06-17 ENCOUNTER — Encounter: Payer: Self-pay | Admitting: Internal Medicine

## 2023-06-17 VITALS — BP 122/70 | HR 84 | Ht 64.0 in | Wt 125.2 lb

## 2023-06-17 DIAGNOSIS — R55 Syncope and collapse: Secondary | ICD-10-CM | POA: Diagnosis not present

## 2023-06-17 DIAGNOSIS — R002 Palpitations: Secondary | ICD-10-CM | POA: Diagnosis not present

## 2023-06-17 DIAGNOSIS — R000 Tachycardia, unspecified: Secondary | ICD-10-CM | POA: Diagnosis not present
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
## 2023-06-17 NOTE — Telephone Encounter (Signed)
 Checking percert on the following patient for testing scheduled at [facility].   GXT   07/03/2023   ZIO 2 WEEKS

## 2023-06-17 NOTE — Patient Instructions (Signed)
 Medication Instructions:  Your physician recommends that you continue on your current medications as directed. Please refer to the Current Medication list given to you today.   Labwork: None  Testing/Procedures: Your physician has recommended that you wear a Zio monitor.   This monitor is a medical device that records the heart's electrical activity. Doctors most often use these monitors to diagnose arrhythmias. Arrhythmias are problems with the speed or rhythm of the heartbeat. The monitor is a small device applied to your chest. You can wear one while you do your normal daily activities. While wearing this monitor if you have any symptoms to push the button and record what you felt. Once you have worn this monitor for the period of time provider prescribed (for 14 days), you will return the monitor device in the postage paid box. Once it is returned they will download the data collected and provide us  with a report which the provider will then review and we will call you with those results. Important tips:  Avoid showering during the first 24 hours of wearing the monitor. Avoid excessive sweating to help maximize wear time. Do not submerge the device, no hot tubs, and no swimming pools. Keep any lotions or oils away from the patch. After 24 hours you may shower with the patch on. Take brief showers with your back facing the shower head.  Do not remove patch once it has been placed because that will interrupt data and decrease adhesive wear time. Push the button when you have any symptoms and write down what you were feeling. Once you have completed wearing your monitor, remove and place into box which has postage paid and place in your outgoing mailbox.  If for some reason you have misplaced your box then call our office and we can provide another box and/or mail it off for you.  Your physician has requested that you have an exercise tolerance test. For further information please visit  [email or web address] Please also follow instruction sheet, as given.   Follow-Up: Your physician recommends that you schedule a follow-up appointment in: Pending Results  Any Other Special Instructions Will Be Listed Below (If Applicable).  If you need a refill on your cardiac medications before your next appointment, please call your pharmacy.

## 2023-06-17 NOTE — Telephone Encounter (Signed)
 PERCERT:  2 week XT

## 2023-07-03 ENCOUNTER — Ambulatory Visit (HOSPITAL_COMMUNITY)
Admission: RE | Admit: 2023-07-03 | Discharge: 2023-07-03 | Disposition: A | Discharge status: Home / Self Care | Source: Ambulatory Visit | Attending: Internal Medicine | Admitting: Internal Medicine

## 2023-07-03 DIAGNOSIS — R002 Palpitations: Secondary | ICD-10-CM | POA: Insufficient documentation

## 2023-07-03 LAB — EXERCISE TOLERANCE TEST
Angina Index: 1
Base ST Depression (mm): 0 mm
Duke Treadmill Score: 3
Estimated workload: 9
Exercise duration (min): 6 min
Exercise duration (sec): 39 s
MPHR: 153 {beats}/min
Peak HR: 160 {beats}/min
Percent HR: 104 %
RPE: 13
Rest HR: 72 {beats}/min
ST Depression (mm): 0 mm

## 2023-07-04 ENCOUNTER — Ambulatory Visit: Payer: Self-pay | Admitting: Internal Medicine

## 2023-07-29 ENCOUNTER — Ambulatory Visit (HOSPITAL_COMMUNITY)
Admission: RE | Admit: 2023-07-29 | Discharge: 2023-07-29 | Disposition: A | Discharge status: Home / Self Care | Source: Ambulatory Visit | Attending: Internal Medicine | Admitting: Internal Medicine

## 2023-07-29 DIAGNOSIS — Z780 Asymptomatic menopausal state: Secondary | ICD-10-CM | POA: Diagnosis not present

## 2023-07-29 DIAGNOSIS — M8588 Other specified disorders of bone density and structure, other site: Secondary | ICD-10-CM | POA: Diagnosis not present

## 2023-07-29 DIAGNOSIS — Z1231 Encounter for screening mammogram for malignant neoplasm of breast: Secondary | ICD-10-CM | POA: Insufficient documentation

## 2023-07-29 DIAGNOSIS — M810 Age-related osteoporosis without current pathological fracture: Secondary | ICD-10-CM | POA: Diagnosis present

## 2023-07-31 DIAGNOSIS — B079 Viral wart, unspecified: Secondary | ICD-10-CM | POA: Diagnosis not present

## 2023-08-09 DIAGNOSIS — R002 Palpitations: Secondary | ICD-10-CM | POA: Diagnosis not present

## 2023-08-14 ENCOUNTER — Ambulatory Visit: Payer: Self-pay | Admitting: Internal Medicine

## 2023-08-14 DIAGNOSIS — M810 Age-related osteoporosis without current pathological fracture: Secondary | ICD-10-CM | POA: Diagnosis not present

## 2023-08-14 MED ORDER — METOPROLOL TARTRATE 25 MG PO TABS: 25.0000 mg | ORAL_TABLET | Freq: Two times a day (BID) | ORAL | 1 refills | Status: DC

## 2023-08-19 ENCOUNTER — Ambulatory Visit: Admitting: Orthopedic Surgery

## 2023-08-25 ENCOUNTER — Ambulatory Visit

## 2023-09-02 ENCOUNTER — Encounter (INDEPENDENT_AMBULATORY_CARE_PROVIDER_SITE_OTHER): Payer: Self-pay | Admitting: Internal Medicine

## 2023-09-02 DIAGNOSIS — I471 Supraventricular tachycardia, unspecified: Secondary | ICD-10-CM

## 2023-09-02 DIAGNOSIS — T887XXA Unspecified adverse effect of drug or medicament, initial encounter: Secondary | ICD-10-CM | POA: Diagnosis not present

## 2023-09-06 NOTE — Telephone Encounter (Signed)
Please see the MyChart message reply(ies) for my assessment and plan.    This patient gave consent for this Medical Advice Message and is aware that it may result in a bill to their insurance company, as well as the possibility of receiving a bill for a co-payment or deductible. They are an established patient, but are not seeking medical advice exclusively about a problem treated during an in person or video visit in the last seven days. I did not recommend an in person or video visit within seven days of my reply.    I spent a total of 5 minutes cumulative time within 7 days through MyChart messaging.  Vishnu P Mallipeddi, MD   

## 2023-09-24 DIAGNOSIS — R000 Tachycardia, unspecified: Secondary | ICD-10-CM | POA: Diagnosis not present

## 2023-09-24 DIAGNOSIS — Z008 Encounter for other general examination: Secondary | ICD-10-CM | POA: Diagnosis not present

## 2023-10-11 ENCOUNTER — Encounter (INDEPENDENT_AMBULATORY_CARE_PROVIDER_SITE_OTHER): Payer: Self-pay | Admitting: *Deleted

## 2023-10-17 ENCOUNTER — Telehealth (INDEPENDENT_AMBULATORY_CARE_PROVIDER_SITE_OTHER): Payer: Self-pay

## 2023-10-17 NOTE — Telephone Encounter (Signed)
 Who is your primary care physician: Darlyn Hurst  Reasons for the colonoscopy: history colon polyps  Have you had a colonoscopy before?  Yes, 2020  Do you have family history of colon cancer? no  Previous colonoscopy with polyps removed? Yes,2020  Do you have a history colorectal cancer?   no  Are you diabetic? If yes, Type 1 or Type 2?    no  Do you have a prosthetic or mechanical heart valve? no  Do you have a pacemaker/defibrillator?   no  Have you had endocarditis/atrial fibrillation? no  Have you had joint replacement within the last 12 months?  no  Do you tend to be constipated or have to use laxatives? no  Do you have any history of drugs or alchohol?  no  Do you use supplemental oxygen?  no  Have you had a stroke or heart attack within the last 6 months? no  Do you take weight loss medication?  no  For female patients: have you had a hysterectomy?  no                                     are you post menopausal?       yes                                            do you still have your menstrual cycle? no      Do you take any blood-thinning medications such as: (aspirin, warfarin, Plavix, Aggrenox)  no  If yes we need the name, milligram, dosage and who is prescribing doctor  Current Outpatient Medications on File Prior to Visit  Medication Sig Dispense Refill   atorvastatin (LIPITOR) 10 MG tablet Take 1 tablet by mouth daily.     Calcium Carbonate-Vit D-Min (CALTRATE 600+D PLUS MINERALS) 600-800 MG-UNIT CHEW Chew 1 tablet by mouth daily.     fluticasone (FLONASE) 50 MCG/ACT nasal spray Place 2 sprays into both nostrils daily.     LORazepam (ATIVAN) 0.5 MG tablet Take 0.5 mg by mouth at bedtime.     meloxicam  (MOBIC ) 7.5 MG tablet TAKE 1 TABLET BY MOUTH EVERY DAY IN THE EVENING 90 tablet 3   montelukast (SINGULAIR) 10 MG tablet Take 10 mg by mouth at bedtime.      Multiple Vitamin (MULTIVITAMIN WITH MINERALS) TABS tablet Take 1 tablet by mouth daily.     Turmeric  500 MG CAPS Take 1,000 mg by mouth 2 (two) times daily.     VITAMIN D PO Take 10,000 Units by mouth daily.     No current facility-administered medications on file prior to visit.    Allergies  Allergen Reactions   Ethinyl Estradiol     Developed Pulmonary Embolus while on this med   Iodinated Contrast Media Swelling and Nausea Only   Iohexol      Desc: FACIAL SWELLING, STOMACH CRAMPS, PT NEEDS 13 HR PRE MEDICATION      Pharmacy: [facility]  Primary Insurance Name: Cornelio Paterson number where you can be reached: [phone]

## 2023-10-17 NOTE — Telephone Encounter (Signed)
 Any room Thanks

## 2023-10-18 MED ORDER — PEG 3350-KCL-NA BICARB-NACL 420 G PO SOLR
4000.0000 mL | Freq: Once | ORAL | 0 refills | Status: AC
Start: 2023-10-18 — End: 2023-10-18

## 2023-10-18 NOTE — Telephone Encounter (Signed)
 Called pt, scheduled TCS for 10/30/2023 at 12:15pm. Rx sent to pharamcy, instructions sent to mychart.

## 2023-10-18 NOTE — Addendum Note (Signed)
 Addended by: DALLIE LIONEL RAMAN on: 10/18/2023 08:51 AM   Modules accepted: Orders

## 2023-10-21 NOTE — Telephone Encounter (Signed)
 Questionnaire from recall, no referral needed

## 2023-10-30 ENCOUNTER — Encounter (HOSPITAL_COMMUNITY)
Admission: RE | Disposition: A | Discharge status: Home / Self Care | Payer: Self-pay | Source: Home / Self Care | Attending: Gastroenterology

## 2023-10-30 ENCOUNTER — Ambulatory Visit (HOSPITAL_COMMUNITY)
Admission: RE | Admit: 2023-10-30 | Discharge: 2023-10-30 | Disposition: A | Discharge status: Home / Self Care | Attending: Gastroenterology | Admitting: Gastroenterology

## 2023-10-30 ENCOUNTER — Other Ambulatory Visit: Payer: Self-pay

## 2023-10-30 ENCOUNTER — Ambulatory Visit (HOSPITAL_COMMUNITY): Admitting: Student

## 2023-10-30 ENCOUNTER — Encounter (HOSPITAL_COMMUNITY): Payer: Self-pay | Admitting: Gastroenterology

## 2023-10-30 ENCOUNTER — Telehealth: Payer: Self-pay | Admitting: *Deleted

## 2023-10-30 DIAGNOSIS — D123 Benign neoplasm of transverse colon: Secondary | ICD-10-CM | POA: Insufficient documentation

## 2023-10-30 DIAGNOSIS — K635 Polyp of colon: Secondary | ICD-10-CM

## 2023-10-30 DIAGNOSIS — E785 Hyperlipidemia, unspecified: Secondary | ICD-10-CM | POA: Diagnosis not present

## 2023-10-30 DIAGNOSIS — K6389 Other specified diseases of intestine: Secondary | ICD-10-CM

## 2023-10-30 DIAGNOSIS — D499 Neoplasm of unspecified behavior of unspecified site: Secondary | ICD-10-CM | POA: Insufficient documentation

## 2023-10-30 DIAGNOSIS — Z1211 Encounter for screening for malignant neoplasm of colon: Secondary | ICD-10-CM | POA: Diagnosis not present

## 2023-10-30 DIAGNOSIS — Z86000 Personal history of in-situ neoplasm of breast: Secondary | ICD-10-CM | POA: Insufficient documentation

## 2023-10-30 DIAGNOSIS — R569 Unspecified convulsions: Secondary | ICD-10-CM

## 2023-10-30 DIAGNOSIS — F418 Other specified anxiety disorders: Secondary | ICD-10-CM

## 2023-10-30 DIAGNOSIS — Z86711 Personal history of pulmonary embolism: Secondary | ICD-10-CM | POA: Insufficient documentation

## 2023-10-30 DIAGNOSIS — R933 Abnormal findings on diagnostic imaging of other parts of digestive tract: Secondary | ICD-10-CM

## 2023-10-30 HISTORY — PX: COLONOSCOPY: SHX5424

## 2023-10-30 SURGERY — COLONOSCOPY
Anesthesia: General

## 2023-10-30 MED ORDER — LACTATED RINGERS IV SOLN: INTRAVENOUS | Status: DC | PRN

## 2023-10-30 MED ORDER — LACTATED RINGERS IV SOLN: INTRAVENOUS | Status: DC

## 2023-10-30 MED ORDER — LIDOCAINE HCL (CARDIAC) PF 100 MG/5ML IV SOSY
PREFILLED_SYRINGE | INTRAVENOUS | Status: DC | PRN
  Administered 2023-10-30: 100 mg via INTRAVENOUS

## 2023-10-30 MED ORDER — LIDOCAINE 2% (20 MG/ML) 5 ML SYRINGE
INTRAMUSCULAR | Status: AC
  Filled 2023-10-30: qty 5

## 2023-10-30 MED ORDER — PROPOFOL 500 MG/50ML IV EMUL
INTRAVENOUS | Status: DC | PRN
  Administered 2023-10-30: 30 mg via INTRAVENOUS
  Administered 2023-10-30: 150 ug/kg/min via INTRAVENOUS

## 2023-10-30 MED ORDER — PROPOFOL 1000 MG/100ML IV EMUL
INTRAVENOUS | Status: AC
  Filled 2023-10-30: qty 100

## 2023-10-30 NOTE — Transfer of Care (Signed)
 Immediate Anesthesia Transfer of Care Note  Patient: Kaitlyn Morrison  Procedure(s) Performed: COLONOSCOPY  Patient Location: PACU  Anesthesia Type:General  Level of Consciousness: awake, alert , and oriented  Airway & Oxygen Therapy: Patient Spontanous Breathing and Patient connected to nasal cannula oxygen  Post-op Assessment: Report given to RN and Post -op Vital signs reviewed and stable  Post vital signs: Reviewed  Last Vitals:  Vitals Value Taken Time  BP 90/75 1153  Temp 97.3 1153  Pulse 66 1153  Resp 16 1153  SpO2 100 1153    Last Pain:  Vitals:   10/30/23 1119  TempSrc:   PainSc: 0-No pain      Patients Stated Pain Goal: 8 (10/30/23 1101)  Complications: There were no known notable events for this encounter.

## 2023-10-30 NOTE — Telephone Encounter (Signed)
 Spoke with pt. She stated her eyes swelled shut and severe upset stomach.

## 2023-10-30 NOTE — Telephone Encounter (Signed)
-----   Message from Toribio Fortune Mayorga sent at 10/30/2023 12:03 PM EDT ----- Hi Autumn/Denea Cheaney/Tammy,   Can you please schedule a CT abdomen pelvis with IV contrast? Dx: abnormal colonoscopy, colon mass.  Thanks,  Toribio Fortune, MD Gastroenterology and Hepatology [facility] Gastroenterology

## 2023-10-30 NOTE — Op Note (Signed)
 [facility] Patient Name: Kaitlyn Morrison Procedure Date: 10/30/2023 11:04 AM MRN: [id number] Date of Birth: 03/21/1955 Attending MD: Toribio Fortune , , [id number] CSN: [id number] Age: 68 Admit Type: Outpatient Procedure:                Colonoscopy Indications:              Surveillance: Personal history of adenomatous                            polyps on last colonoscopy 5 years ago Providers:                Toribio Fortune, Crystal Page, Dorcas Lenis,                            Technician Referring MD:              Medicines:                Monitored Anesthesia Care Complications:            No immediate complications. Estimated Blood Loss:     Estimated blood loss: none. Procedure:                Pre-Anesthesia Assessment:                           - Prior to the procedure, a History and Physical                            was performed, and patient medications, allergies                            and sensitivities were reviewed. The patient's                            tolerance of previous anesthesia was reviewed.                           - The risks and benefits of the procedure and the                            sedation options and risks were discussed with the                            patient. All questions were answered and informed                            consent was obtained.                           - ASA Grade Assessment: II - A patient with mild                            systemic disease.                           After obtaining informed consent, the colonoscope  was passed under direct vision. Throughout the                            procedure, the patient's blood pressure, pulse, and                            oxygen saturations were monitored continuously. The                            [id number] ([id number]) Peds Colon was introduced                            through the anus and advanced to the the cecum,                             identified by appendiceal orifice and ileocecal                            valve. The colonoscopy was performed without                            difficulty. The patient tolerated the procedure                            well. The quality of the bowel preparation was                            excellent. Scope In: 11:22:50 AM Scope Out: 11:49:22 AM Scope Withdrawal Time: 0 hours 18 minutes 1 second  Total Procedure Duration: 0 hours 26 minutes 32 seconds  Findings:      The perianal and digital rectal examinations were normal.      A submucosal non-obstructing large mass was found in the cecum. This       mass appeared to be coming from a structure outside of the colon - it       appeared to be causing external compression and indentation.      A 2 mm polyp was found in the transverse colon. The polyp was sessile.       The polyp was removed with a cold snare. Resection and retrieval were       complete.      The retroflexed view of the distal rectum and anal verge was normal and       showed no anal or rectal abnormalities. Impression:               - Extracolonic mass compressing the cecum.                           - One 2 mm polyp in the transverse colon, removed                            with a cold snare. Resected and retrieved.                           - The distal rectum and anal verge are normal on  retroflexion view. Moderate Sedation:      Per Anesthesia Care Recommendation:           - Discharge patient to home (ambulatory).                           - Resume previous diet.                           - Await pathology results.                           - Repeat colonoscopy for surveillance based on                            pathology results.                           - Perform CT abdomen and pelvis with IV contrast. Procedure Code(s):        --- Professional ---                           [phone], Colonoscopy, flexible; with removal  of                            tumor(s), polyp(s), or other lesion(s) by snare                            technique Diagnosis Code(s):        --- Professional ---                           Z86.010, Personal history of colonic polyps                           D49.0, Neoplasm of unspecified behavior of                            digestive system                           D12.3, Benign neoplasm of transverse colon (hepatic                            flexure or splenic flexure) CPT copyright 2022 American Medical Association. All rights reserved. The codes documented in this report are preliminary and upon coder review may  be revised to meet current compliance requirements. Toribio Fortune, MD Toribio Fortune,  10/30/2023 12:02:55 PM This report has been signed electronically. Number of Addenda: 0

## 2023-10-30 NOTE — Telephone Encounter (Signed)
 Checked availity and no PA is needed

## 2023-10-30 NOTE — Telephone Encounter (Signed)
 Dr. Eartha, she has allergy to the contrast. Do you want to pre-medicate or order with no IV contrast?

## 2023-10-30 NOTE — Discharge Instructions (Signed)
 You are being discharged to home.  Resume your previous diet.  We are waiting for your pathology results.  Your physician has recommended a repeat colonoscopy for surveillance based on pathology results.  Perform CT abdomen and pelvis with IV contrast.

## 2023-10-30 NOTE — Telephone Encounter (Signed)
 Can you please ask her what alelrgy she has? It says swelling and itching.

## 2023-10-30 NOTE — Anesthesia Preprocedure Evaluation (Signed)
 Anesthesia Evaluation  Patient identified by MRN, date of birth, ID band Patient awake    Reviewed: Allergy & Precautions, H&P , NPO status , Patient's Chart, lab work & pertinent test results, reviewed documented beta blocker date and time   Airway Mallampati: II  TM Distance: >3 FB Neck ROM: full    Dental no notable dental hx. (+) Dental Advisory Given, Teeth Intact   Pulmonary PE   Pulmonary exam normal breath sounds clear to auscultation       Cardiovascular Exercise Tolerance: Good negative cardio ROS Normal cardiovascular exam Rhythm:regular Rate:Normal     Neuro/Psych Seizures -,  PSYCHIATRIC DISORDERS Anxiety Depression     Neuromuscular disease    GI/Hepatic Neg liver ROS,GERD  ,,  Endo/Other  negative endocrine ROS    Renal/GU negative Renal ROS  negative genitourinary   Musculoskeletal  (+) Arthritis , Osteoarthritis,    Abdominal   Peds  Hematology negative hematology ROS (+)   Anesthesia Other Findings Breast cancer  Reproductive/Obstetrics negative OB ROS                              Anesthesia Physical Anesthesia Plan  ASA: 3  Anesthesia Plan: General   Post-op Pain Management: Minimal or no pain anticipated   Induction:   PONV Risk Score and Plan: Propofol  infusion  Airway Management Planned: Natural Airway and Nasal Cannula  Additional Equipment: None  Intra-op Plan:   Post-operative Plan:   Informed Consent: I have reviewed the patients History and Physical, chart, labs and discussed the procedure including the risks, benefits and alternatives for the proposed anesthesia with the patient or authorized representative who has indicated his/her understanding and acceptance.     Dental Advisory Given  Plan Discussed with: CRNA  Anesthesia Plan Comments:          Anesthesia Quick Evaluation

## 2023-10-30 NOTE — Anesthesia Postprocedure Evaluation (Signed)
 Anesthesia Post Note  Patient: Kaitlyn Morrison  Procedure(s) Performed: COLONOSCOPY  Patient location during evaluation: Endoscopy Anesthesia Type: General Level of consciousness: awake and alert Pain management: pain level controlled Vital Signs Assessment: post-procedure vital signs reviewed and stable Respiratory status: spontaneous breathing, nonlabored ventilation and respiratory function stable Cardiovascular status: stable Anesthetic complications: no   There were no known notable events for this encounter.   Last Vitals:  Vitals:   10/30/23 1154 10/30/23 1200  BP: (!) 109/59 129/61  Pulse: 67 66  Resp: 14 15  Temp: 36.6 C   SpO2: 99% 99%    Last Pain:  Vitals:   10/30/23 1206  TempSrc:   PainSc: 3                  Ora Bollig L Molley Houser

## 2023-10-30 NOTE — Telephone Encounter (Signed)
 Lets do a non contrast CT Thanks
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
## 2023-10-31 ENCOUNTER — Encounter (HOSPITAL_COMMUNITY): Payer: Self-pay | Admitting: Gastroenterology

## 2023-10-31 ENCOUNTER — Ambulatory Visit (INDEPENDENT_AMBULATORY_CARE_PROVIDER_SITE_OTHER): Payer: Self-pay | Admitting: Gastroenterology

## 2023-10-31 LAB — SURGICAL PATHOLOGY

## 2023-10-31 NOTE — Progress Notes (Signed)
 7 yr TCS noted in recall Patient result letter mailed procedure note and pathology result faxed to PCP

## 2023-10-31 NOTE — Telephone Encounter (Signed)
 Patient called stated that she was confused about the details of her CT scan. She stated that it is supposed to be without contrast. I explained to the patient that she is not getting the IV contrast because she has an allergy to that and that she will be getting the oral contrast because it is a different kind of contrast. Patient verbalized understanding.

## 2023-11-04 ENCOUNTER — Ambulatory Visit (INDEPENDENT_AMBULATORY_CARE_PROVIDER_SITE_OTHER): Payer: Self-pay | Admitting: Gastroenterology

## 2023-11-04 ENCOUNTER — Ambulatory Visit (HOSPITAL_COMMUNITY)
Admission: RE | Admit: 2023-11-04 | Discharge: 2023-11-04 | Disposition: A | Discharge status: Home / Self Care | Source: Ambulatory Visit | Attending: Gastroenterology | Admitting: Gastroenterology

## 2023-11-04 DIAGNOSIS — R933 Abnormal findings on diagnostic imaging of other parts of digestive tract: Secondary | ICD-10-CM | POA: Diagnosis not present

## 2023-11-04 DIAGNOSIS — K6389 Other specified diseases of intestine: Secondary | ICD-10-CM | POA: Diagnosis not present

## 2023-11-04 MED ORDER — IOHEXOL 9 MG/ML PO SOLN
500.0000 mL | ORAL | Status: AC
  Administered 2023-11-04: 500 mL via ORAL

## 2023-11-06 DIAGNOSIS — Z853 Personal history of malignant neoplasm of breast: Secondary | ICD-10-CM | POA: Diagnosis not present

## 2023-11-06 DIAGNOSIS — R000 Tachycardia, unspecified: Secondary | ICD-10-CM | POA: Diagnosis not present

## 2023-11-06 DIAGNOSIS — Z23 Encounter for immunization: Secondary | ICD-10-CM | POA: Diagnosis not present

## 2023-11-06 DIAGNOSIS — E782 Mixed hyperlipidemia: Secondary | ICD-10-CM | POA: Diagnosis not present

## 2023-11-06 DIAGNOSIS — F329 Major depressive disorder, single episode, unspecified: Secondary | ICD-10-CM | POA: Diagnosis not present

## 2023-11-06 DIAGNOSIS — I700 Atherosclerosis of aorta: Secondary | ICD-10-CM | POA: Insufficient documentation

## 2023-11-06 DIAGNOSIS — N83201 Unspecified ovarian cyst, right side: Secondary | ICD-10-CM | POA: Insufficient documentation

## 2023-11-06 DIAGNOSIS — M25561 Pain in right knee: Secondary | ICD-10-CM | POA: Diagnosis not present

## 2023-11-06 DIAGNOSIS — M189 Osteoarthritis of first carpometacarpal joint, unspecified: Secondary | ICD-10-CM | POA: Diagnosis not present

## 2023-11-06 DIAGNOSIS — M810 Age-related osteoporosis without current pathological fracture: Secondary | ICD-10-CM | POA: Diagnosis not present

## 2023-11-06 DIAGNOSIS — M4317 Spondylolisthesis, lumbosacral region: Secondary | ICD-10-CM | POA: Diagnosis not present

## 2023-11-06 DIAGNOSIS — J302 Other seasonal allergic rhinitis: Secondary | ICD-10-CM | POA: Diagnosis not present

## 2023-11-06 DIAGNOSIS — G4700 Insomnia, unspecified: Secondary | ICD-10-CM | POA: Diagnosis not present

## 2023-11-13 ENCOUNTER — Encounter (INDEPENDENT_AMBULATORY_CARE_PROVIDER_SITE_OTHER): Payer: Self-pay | Admitting: Gastroenterology

## 2023-11-15 DIAGNOSIS — R051 Acute cough: Secondary | ICD-10-CM | POA: Diagnosis not present

## 2023-11-15 DIAGNOSIS — Z20822 Contact with and (suspected) exposure to covid-19: Secondary | ICD-10-CM | POA: Diagnosis not present

## 2023-12-02 ENCOUNTER — Encounter: Payer: Self-pay | Admitting: Radiology

## 2023-12-03 ENCOUNTER — Ambulatory Visit: Attending: Internal Medicine | Admitting: Internal Medicine

## 2023-12-03 ENCOUNTER — Telehealth: Payer: Self-pay

## 2023-12-03 ENCOUNTER — Encounter: Payer: Self-pay | Admitting: Internal Medicine

## 2023-12-03 VITALS — BP 124/64 | HR 63 | Ht 64.0 in | Wt 122.0 lb

## 2023-12-03 DIAGNOSIS — I2510 Atherosclerotic heart disease of native coronary artery without angina pectoris: Secondary | ICD-10-CM | POA: Diagnosis not present

## 2023-12-03 DIAGNOSIS — E7849 Other hyperlipidemia: Secondary | ICD-10-CM | POA: Diagnosis not present

## 2023-12-03 DIAGNOSIS — R000 Tachycardia, unspecified: Secondary | ICD-10-CM

## 2023-12-03 DIAGNOSIS — Z8249 Family history of ischemic heart disease and other diseases of the circulatory system: Secondary | ICD-10-CM | POA: Insufficient documentation

## 2023-12-03 DIAGNOSIS — I471 Supraventricular tachycardia, unspecified: Secondary | ICD-10-CM | POA: Insufficient documentation

## 2023-12-03 DIAGNOSIS — E785 Hyperlipidemia, unspecified: Secondary | ICD-10-CM | POA: Insufficient documentation

## 2023-12-03 MED ORDER — ATORVASTATIN CALCIUM 20 MG PO TABS: 20.0000 mg | ORAL_TABLET | Freq: Every day | ORAL | 3 refills | Status: AC

## 2023-12-03 MED ORDER — DILTIAZEM HCL ER 60 MG PO CP12: 60.0000 mg | ORAL_CAPSULE | Freq: Two times a day (BID) | ORAL | 2 refills | Status: DC

## 2023-12-03 MED ORDER — ASPIRIN 81 MG PO TBEC: 81.0000 mg | DELAYED_RELEASE_TABLET | Freq: Every day | ORAL | 5 refills | Status: AC

## 2023-12-03 NOTE — Progress Notes (Signed)
ek 

## 2023-12-03 NOTE — Telephone Encounter (Signed)
 Per Dr. Mallipeddi after patient had left:  Obtain LP(a) levels. Diagnosis is HLD. I already told her in the office visit but forgot to add it.   Called patient stated that she will have Dr. Milford office complete it on Thursday when she goes to see him. Patient verbalized understanding.
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
## 2023-12-03 NOTE — Patient Instructions (Addendum)
 Medication Instructions:  Your physician has recommended you make the following change in your medication:  Stop taking Metoprolol   Increase Atorvastatin from 10 mg to 20 mg daily at bedtime  Start Aspirin 81 mg once daily Start Diltiazem 60 mg twice daily Continue taking all other medications as prescribed   Labwork: None  Testing/Procedures: [place]  Follow-Up: Your physician recommends that you schedule a follow-up appointment in: 6 months  Any Other Special Instructions Will Be Listed Below (If Applicable). Thank you for choosing  [facility]!     If you need a refill on your cardiac medications before your next appointment, please call your pharmacy.

## 2023-12-07 LAB — LIPOPROTEIN A (LPA): Lipoprotein (a): 48.8 nmol/L (ref <75.0)

## 2023-12-12 ENCOUNTER — Encounter: Payer: Self-pay | Admitting: Internal Medicine

## 2023-12-12 ENCOUNTER — Ambulatory Visit: Payer: Self-pay | Admitting: Internal Medicine

## 2023-12-16 ENCOUNTER — Ambulatory Visit: Admitting: Orthopedic Surgery

## 2023-12-18 ENCOUNTER — Ambulatory Visit: Admitting: Orthopedic Surgery

## 2023-12-18 ENCOUNTER — Other Ambulatory Visit (INDEPENDENT_AMBULATORY_CARE_PROVIDER_SITE_OTHER): Payer: Self-pay

## 2023-12-18 ENCOUNTER — Encounter: Payer: Self-pay | Admitting: Orthopedic Surgery

## 2023-12-18 VITALS — BP 124/64 | Ht 64.0 in | Wt 122.0 lb

## 2023-12-18 DIAGNOSIS — M7711 Lateral epicondylitis, right elbow: Secondary | ICD-10-CM

## 2023-12-18 DIAGNOSIS — M25521 Pain in right elbow: Secondary | ICD-10-CM

## 2023-12-18 MED ORDER — METHYLPREDNISOLONE ACETATE 40 MG/ML IJ SUSP
40.0000 mg | Freq: Once | INTRAMUSCULAR | Status: AC
  Administered 2023-12-18: 40 mg via INTRA_ARTICULAR

## 2023-12-18 NOTE — Progress Notes (Signed)
  Intake history:  Chief Complaint  Patient presents with   Elbow Pain    Right      BP 124/64 Comment: 12/03/23  Ht 5' 4 (1.626 m)   Wt 122 lb (55.3 kg)   BMI 20.94 kg/m  Body mass index is 20.94 kg/m.  Pharmacy? ____[facility]__________________________________  WHAT ARE WE SEEING YOU FOR TODAY?   Right elbow  How long has this bothered you? (DOI?DOS?WS?)  2 months  Was there an injury? No  Anticoag.  No   Any ALLERGIES _________________ Allergies  Allergen Reactions   Ethinyl Estradiol     Developed Pulmonary Embolus while on this med   Iodinated Contrast Media Swelling and Nausea Only   Iohexol      Desc: FACIAL SWELLING, STOMACH CRAMPS, PT NEEDS 13 HR PRE MEDICATION    _____________________________   Treatment:  Have you taken:  Tylenol  No  Advil  Yes  Had PT No  Had injection No  Other  _________________________

## 2023-12-18 NOTE — Progress Notes (Signed)
   Office Visit Note   Patient: Kaitlyn Morrison           Date of Birth: [birth year]           MRN: [id number] Visit Date: 12/18/2023 Requested by: Shona Norleen PEDLAR, MD [address] [place],  [place] [address] PCP: Shona Norleen PEDLAR, MD   Chief Complaint  Patient presents with   Elbow Pain    Right     68 year old female 56-month history of right elbow pain located laterally.  The patient says she has been keyboarding a lot She tried to wear tennis elbow brace but it keeps sliding down  Review of systems this is not associated with numbness or tingling it is exacerbated by wrist extension  Focused right elbow examination  The skin is normal there is no rash or redness The tenderness is located over the lateral epicondyle The range of motion is normal Painful wrist extension Neurovascular exam is intact  Imaging was normal  DG Elbow 2 Views Right Result Date: 12/18/2023 X-ray right elbow Elbow pain X-ray [facility] lateral right elbow no fracture dislocation or degenerative arthritis no bone spurs or accessory processes Normal elbow x-ray     Encounter Diagnoses  Name Primary?   Pain in right elbow Yes   Right tennis elbow    Assessment and plan  Acute right tennis elbow recommend standard treatment  Tennis elbow brace Cortisone injection Cryotherapy This elbow exercises Tennis elbow mouse pad Return if needed  Procedure right tennis elbow injection   Procedure note for injection   Chief Complaint  Patient presents with   Elbow Pain    Right      Encounter Diagnoses  Name Primary?   Pain in right elbow Yes   Right tennis elbow         The patient has consented for injection of the lateral right elbow, lateral epicondyle/ECRB tendon  Medication: Depo-Medrol  40 mg and lidocaine 1%  Time out completed: Yes  The site of injection was cleaned with alcohol and ethyl chloride.  The injection was given without any complications appropriate precautions were  given.

## 2023-12-18 NOTE — Addendum Note (Signed)
 Addended byBETHA JENEAN GREIG LELON on: 12/18/2023 09:10 AM   Modules accepted: Orders

## 2023-12-18 NOTE — Patient Instructions (Signed)
 Wear the brace for all activities with the wrist and hand  Obtain a wrist pad for tennis elbow for computer work  Cryo/ice therapy  Exercises  Topical medications are helpful as well

## 2023-12-30 ENCOUNTER — Encounter: Payer: Self-pay | Admitting: Internal Medicine

## 2024-01-01 MED ORDER — DILTIAZEM HCL ER COATED BEADS 120 MG PO CP24: 120.0000 mg | ORAL_CAPSULE | Freq: Every day | ORAL | 2 refills | Status: AC

## 2024-01-13 ENCOUNTER — Encounter: Payer: Self-pay | Admitting: Orthopedic Surgery

## 2024-01-13 ENCOUNTER — Other Ambulatory Visit: Payer: Self-pay

## 2024-01-13 ENCOUNTER — Ambulatory Visit: Admitting: Orthopedic Surgery

## 2024-01-13 VITALS — BP 124/64 | Ht 64.0 in | Wt 122.0 lb

## 2024-01-13 DIAGNOSIS — M542 Cervicalgia: Secondary | ICD-10-CM

## 2024-01-13 DIAGNOSIS — M5030 Other cervical disc degeneration, unspecified cervical region: Secondary | ICD-10-CM

## 2024-01-13 MED ORDER — IBUPROFEN 800 MG PO TABS: 800.0000 mg | ORAL_TABLET | Freq: Three times a day (TID) | ORAL | 1 refills | Status: AC | PRN

## 2024-01-13 MED ORDER — METHOCARBAMOL 500 MG PO TABS: 500.0000 mg | ORAL_TABLET | Freq: Three times a day (TID) | ORAL | 5 refills | Status: AC

## 2024-01-13 NOTE — Progress Notes (Signed)
° °  Chief Complaint  Patient presents with   Shoulder Pain    Right scapula   Neck Pain    68 year old female with neck pain for the last 2 to 3 weeks started in the cervical spine and radiated into the right shoulder blade.  Robaxin  did not help the pain, did get some relief from Valium .    Physical Exam   Patient has tenderness in the cervical spine along the posterior elements with tenderness radiating into the periscapular area of the right shoulder associated with range of motion deficit  She does not exhibit any neurovascular deficits or motor deficits in the upper extremities  DG Cervical Spine 2 or 3 views Result Date: 01/13/2024 C-spine imaging Neck pain radiating into the scapula Alignment: Loss of normal cervical lordosis Disc spaces narrowing seen at the C4-5 interval. Anterior lipping of the 456 vertebrae On the anterior film we see extensive arthritis in the mid cervical spine from C3-C6 Spondylosis lack of lordosis C-spine      Assessment and Plan:   Cervical disc disease   Tylenol  Meds ordered this encounter  Medications   ibuprofen  (ADVIL ) 800 MG tablet    Sig: Take 1 tablet (800 mg total) by mouth every 8 (eight) hours as needed.    Dispense:  90 tablet    Refill:  1   methocarbamol  (ROBAXIN ) 500 MG tablet    Sig: Take 1 tablet (500 mg total) by mouth 3 (three) times daily.    Dispense:  60 tablet    Refill:  5   Phys Therapy    Recheck 6 weeks

## 2024-01-13 NOTE — Progress Notes (Signed)
° ° °  Intake history:  Chief Complaint  Patient presents with   Shoulder Pain    Right scapula   Neck Pain     BP 124/64 Comment: 12/18/23  Ht 5' 4 (1.626 m)   Wt 122 lb (55.3 kg)   BMI 20.94 kg/m  Body mass index is 20.94 kg/m.  Pharmacy? ____[facility]__________________________________  WHAT ARE WE SEEING YOU FOR TODAY?   Right shoulder  How long has this bothered you? (DOI?DOS?WS?)  two weeks / worse  Was there an injury? No  Anticoag.  No    Any ALLERGIES ___as of 01/13/2024 9:58 AM      Allergen Reactions   Ethinyl Estradiol        Developed Pulmonary Embolus while on this med   Iodinated Contrast Media Swelling and Nausea Only   Iohexol          Desc: FACIAL SWELLING, STOMACH CRAMPS, PT NEEDS 13 HR PRE MEDICATION    ___________________________________________   Treatment:  Have you taken:  Tylenol  No  Advil  No  Had PT No  Had injection No  Other  _____________took Diazepam  last night ____________

## 2024-01-13 NOTE — Patient Instructions (Addendum)
 For pain   Start with moist heat 20 min 4 x a day   Take :  Tylenol  500 mg every 6 hours  Ibuprofen  800 3 x a day  valium  at night as needed  and robaxin  during the day every 6 hours   Go to Phys Therapy   Physical therapy has been ordered for you at [facility]. They should call you to schedule, [phone] is the phone number to call, if you want to call to schedule.

## 2024-02-03 ENCOUNTER — Ambulatory Visit: Admitting: Orthopedic Surgery
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
## 2024-02-18 ENCOUNTER — Ambulatory Visit (HOSPITAL_COMMUNITY): Attending: Orthopedic Surgery

## 2024-02-18 ENCOUNTER — Other Ambulatory Visit: Payer: Self-pay

## 2024-02-18 DIAGNOSIS — M436 Torticollis: Secondary | ICD-10-CM | POA: Insufficient documentation

## 2024-02-18 DIAGNOSIS — M5412 Radiculopathy, cervical region: Secondary | ICD-10-CM | POA: Diagnosis present

## 2024-02-18 DIAGNOSIS — M542 Cervicalgia: Secondary | ICD-10-CM | POA: Insufficient documentation

## 2024-02-18 NOTE — Patient Instructions (Signed)

## 2024-02-24 ENCOUNTER — Ambulatory Visit: Admitting: Orthopedic Surgery

## 2024-02-27 ENCOUNTER — Ambulatory Visit (HOSPITAL_COMMUNITY)

## 2024-02-27 DIAGNOSIS — M436 Torticollis: Secondary | ICD-10-CM

## 2024-02-27 DIAGNOSIS — M542 Cervicalgia: Secondary | ICD-10-CM

## 2024-02-27 DIAGNOSIS — M5412 Radiculopathy, cervical region: Secondary | ICD-10-CM
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
## 2024-03-02 ENCOUNTER — Ambulatory Visit: Admitting: Orthopedic Surgery

## 2024-03-04 ENCOUNTER — Ambulatory Visit (HOSPITAL_COMMUNITY)

## 2024-03-04 DIAGNOSIS — M436 Torticollis: Secondary | ICD-10-CM

## 2024-03-04 DIAGNOSIS — M542 Cervicalgia: Secondary | ICD-10-CM

## 2024-03-04 DIAGNOSIS — M5412 Radiculopathy, cervical region: Secondary | ICD-10-CM
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
## 2024-03-05 ENCOUNTER — Ambulatory Visit: Admitting: Orthopedic Surgery

## 2024-03-05 ENCOUNTER — Encounter: Payer: Self-pay | Admitting: Orthopedic Surgery

## 2024-03-05 DIAGNOSIS — M7711 Lateral epicondylitis, right elbow: Secondary | ICD-10-CM

## 2024-03-05 DIAGNOSIS — M5030 Other cervical disc degeneration, unspecified cervical region: Secondary | ICD-10-CM

## 2024-03-05 MED ORDER — METHYLPREDNISOLONE ACETATE 40 MG/ML IJ SUSP
40.0000 mg | Freq: Once | INTRAMUSCULAR | Status: AC
  Administered 2024-03-05: 40 mg via INTRA_ARTICULAR

## 2024-03-05 NOTE — Progress Notes (Signed)
" ° °  Chief Complaint  Patient presents with   Neck Pain    better   Elbow Pain    Right/ painful     HPI Kaitlyn Morrison has improved in terms of her cervical disc disease with no periscapular or shoulder blade pain although she still having stiffness turning the neck to the left  She also has chronic tennis elbow.  She is wearing a brace.  She has pain along the lateral epicondyle again.  Last injection was November.  PHYSICAL EXAM:  Normal rotation of the cervical spine turning the head to the right decreased to the left  Tenderness lateral epicondyle pain with wrist extension   Assessment and Plan:  Encounter Diagnoses  Name Primary?   Degeneration of cervical intervertebral disc Yes   Right tennis elbow     Degenerative disc disease improved continue with physical therapy  Continue bracing and inject right tennis elbow    Procedure note for injection   Chief Complaint  Patient presents with   Neck Pain    better   Elbow Pain    Right/ painful      Encounter Diagnoses  Name Primary?   Degeneration of cervical intervertebral disc Yes   Right tennis elbow         The patient has consented for injection of the Joint: Lateral epicondyle elbow  right  Medication: Depo-Medrol  40 mg and lidocaine  1%  Time out completed: Yes  The site of injection was cleaned with alcohol and ethyl chloride.  The injection was given without any complications appropriate precautions were given.    "

## 2024-03-05 NOTE — Progress Notes (Signed)
" ° ° °  03/05/2024   Chief Complaint  Patient presents with   Neck Pain    better   Elbow Pain    Right/ painful     No diagnosis found.  What pharmacy do you use ? _____[facility]______________________  DOI/DOS/ Date:    Did you get better, worse or no change (Answer below)   Improved      "

## 2024-03-13 ENCOUNTER — Ambulatory Visit (HOSPITAL_COMMUNITY)

## 2024-03-19 ENCOUNTER — Ambulatory Visit (HOSPITAL_COMMUNITY)

## 2024-03-26 ENCOUNTER — Ambulatory Visit (HOSPITAL_COMMUNITY)

## 2024-04-02 ENCOUNTER — Ambulatory Visit (HOSPITAL_COMMUNITY)
